# Patient Record
Sex: Female | Born: 1985 | Race: White | Hispanic: No | Smoking: Never smoker
Health system: Southern US, Community
[De-identification: ages and names within clinical notes are randomized; demographics above are authoritative.]

## PROBLEM LIST (undated history)

## (undated) DIAGNOSIS — O039 Complete or unspecified spontaneous abortion without complication: Secondary | ICD-10-CM

## (undated) DIAGNOSIS — R87619 Unspecified abnormal cytological findings in specimens from cervix uteri: Secondary | ICD-10-CM

## (undated) DIAGNOSIS — C539 Malignant neoplasm of cervix uteri, unspecified: Secondary | ICD-10-CM

## (undated) DIAGNOSIS — IMO0002 Reserved for concepts with insufficient information to code with codable children: Secondary | ICD-10-CM

## (undated) DIAGNOSIS — O159 Eclampsia, unspecified as to time period: Secondary | ICD-10-CM

## (undated) DIAGNOSIS — R569 Unspecified convulsions: Secondary | ICD-10-CM

## (undated) HISTORY — PX: CERVICAL BIOPSY  W/ LOOP ELECTRODE EXCISION: SUR135

## (undated) HISTORY — PX: DILATION AND CURETTAGE OF UTERUS: SHX78

## (undated) HISTORY — PX: WISDOM TOOTH EXTRACTION: SHX21

## (undated) HISTORY — DX: Complete or unspecified spontaneous abortion without complication: O03.9

## (undated) SURGERY — Surgical Case
Anesthesia: *Unknown

---
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
## 2004-01-03 ENCOUNTER — Emergency Department (HOSPITAL_COMMUNITY): Admission: EM | Admit: 2004-01-03 | Discharge: 2004-01-03 | Payer: Self-pay | Admitting: Emergency Medicine

## 2004-02-16 ENCOUNTER — Emergency Department (HOSPITAL_COMMUNITY): Admission: EM | Admit: 2004-02-16 | Discharge: 2004-02-16 | Payer: Self-pay | Admitting: Emergency Medicine

## 2004-04-26 ENCOUNTER — Emergency Department (HOSPITAL_COMMUNITY): Admission: EM | Admit: 2004-04-26 | Discharge: 2004-04-26 | Payer: Self-pay | Admitting: Emergency Medicine

## 2004-09-09 ENCOUNTER — Emergency Department (HOSPITAL_COMMUNITY): Admission: EM | Admit: 2004-09-09 | Discharge: 2004-09-09 | Payer: Self-pay | Admitting: Emergency Medicine

## 2005-02-02 ENCOUNTER — Emergency Department (HOSPITAL_COMMUNITY): Admission: EM | Admit: 2005-02-02 | Discharge: 2005-02-02 | Payer: Self-pay | Admitting: Emergency Medicine

## 2005-03-20 ENCOUNTER — Emergency Department (HOSPITAL_COMMUNITY): Admission: EM | Admit: 2005-03-20 | Discharge: 2005-03-20 | Payer: Self-pay | Admitting: Emergency Medicine

## 2005-04-16 ENCOUNTER — Emergency Department (HOSPITAL_COMMUNITY): Admission: EM | Admit: 2005-04-16 | Discharge: 2005-04-16 | Payer: Self-pay | Admitting: Family Medicine

## 2005-07-29 ENCOUNTER — Emergency Department (HOSPITAL_COMMUNITY): Admission: EM | Admit: 2005-07-29 | Discharge: 2005-07-29 | Payer: Self-pay | Admitting: *Deleted

## 2005-07-30 ENCOUNTER — Other Ambulatory Visit: Admission: RE | Admit: 2005-07-30 | Discharge: 2005-07-30 | Payer: Self-pay | Admitting: Obstetrics and Gynecology

## 2005-11-02 DIAGNOSIS — R569 Unspecified convulsions: Secondary | ICD-10-CM

## 2005-11-02 HISTORY — PX: OTHER SURGICAL HISTORY: SHX169

## 2005-11-02 HISTORY — DX: Unspecified convulsions: R56.9

## 2006-02-11 ENCOUNTER — Emergency Department (HOSPITAL_COMMUNITY): Admission: EM | Admit: 2006-02-11 | Discharge: 2006-02-12 | Payer: Self-pay | Admitting: Emergency Medicine

## 2006-04-10 ENCOUNTER — Inpatient Hospital Stay (HOSPITAL_COMMUNITY): Admission: AD | Admit: 2006-04-10 | Discharge: 2006-04-10 | Payer: Self-pay | Admitting: Obstetrics and Gynecology

## 2006-06-13 ENCOUNTER — Observation Stay (HOSPITAL_COMMUNITY): Admission: AD | Admit: 2006-06-13 | Discharge: 2006-06-14 | Payer: Self-pay | Admitting: Obstetrics and Gynecology

## 2006-11-02 HISTORY — PX: BREAST ENHANCEMENT SURGERY: SHX7

## 2006-11-11 ENCOUNTER — Emergency Department (HOSPITAL_COMMUNITY): Admission: EM | Admit: 2006-11-11 | Discharge: 2006-11-11 | Payer: Self-pay | Admitting: Emergency Medicine

## 2006-11-12 ENCOUNTER — Emergency Department (HOSPITAL_COMMUNITY): Admission: EM | Admit: 2006-11-12 | Discharge: 2006-11-12 | Payer: Self-pay | Admitting: Emergency Medicine

## 2006-11-15 ENCOUNTER — Encounter: Admission: RE | Admit: 2006-11-15 | Discharge: 2006-11-15 | Payer: Self-pay | Admitting: Cardiovascular Disease

## 2006-11-26 ENCOUNTER — Emergency Department (HOSPITAL_COMMUNITY): Admission: EM | Admit: 2006-11-26 | Discharge: 2006-11-26 | Payer: Self-pay | Admitting: Emergency Medicine

## 2007-02-05 ENCOUNTER — Inpatient Hospital Stay (HOSPITAL_COMMUNITY): Admission: AD | Admit: 2007-02-05 | Discharge: 2007-02-05 | Payer: Self-pay | Admitting: Obstetrics and Gynecology

## 2007-08-26 IMAGING — CR DG CHEST 2V
2 series · 2 of 2 positions shown · non-contrast
Comparison: 11/11/06.

CLINICAL DATA: Chest pain.
 CHEST - 2 VIEW:

[w chest pa]
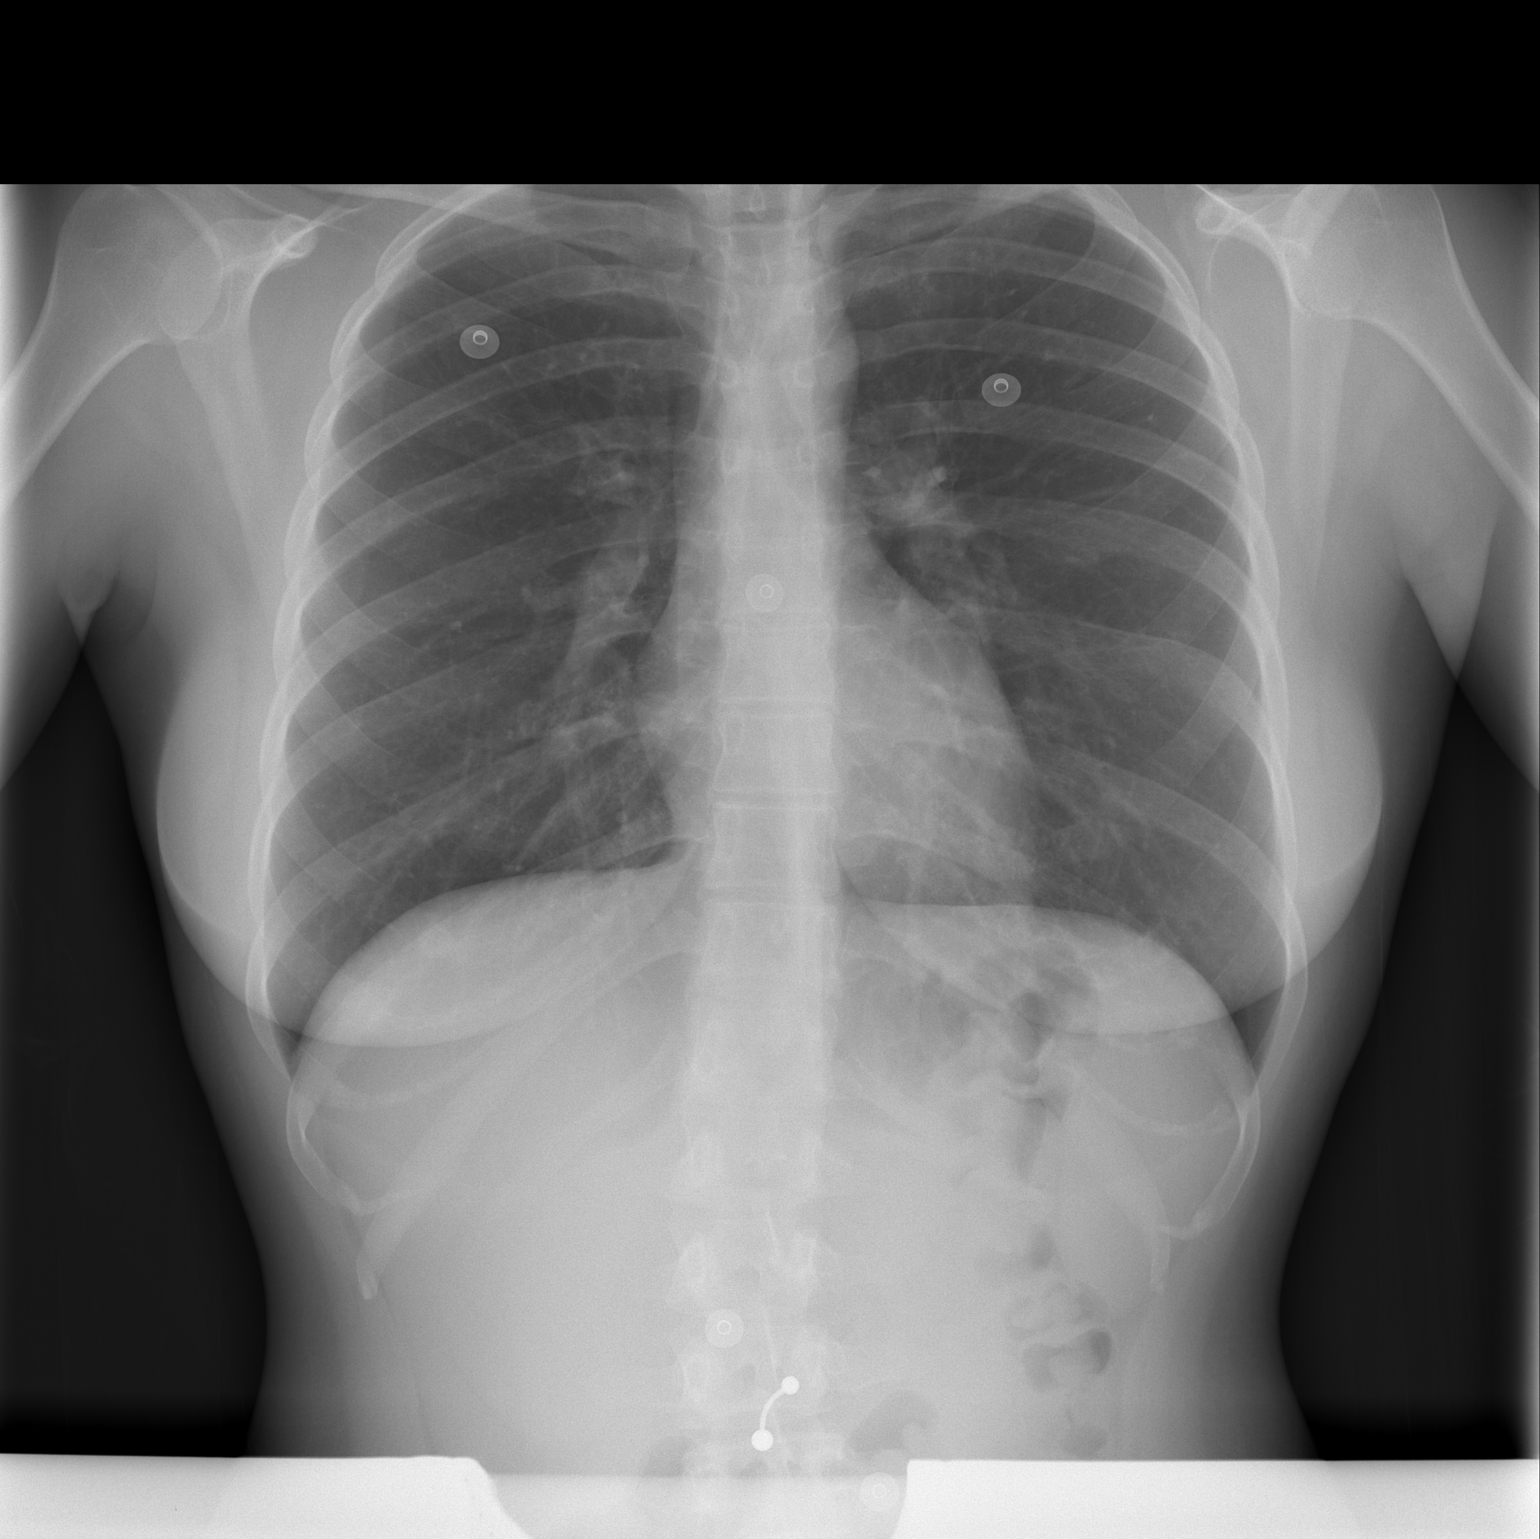

[w chest lat]
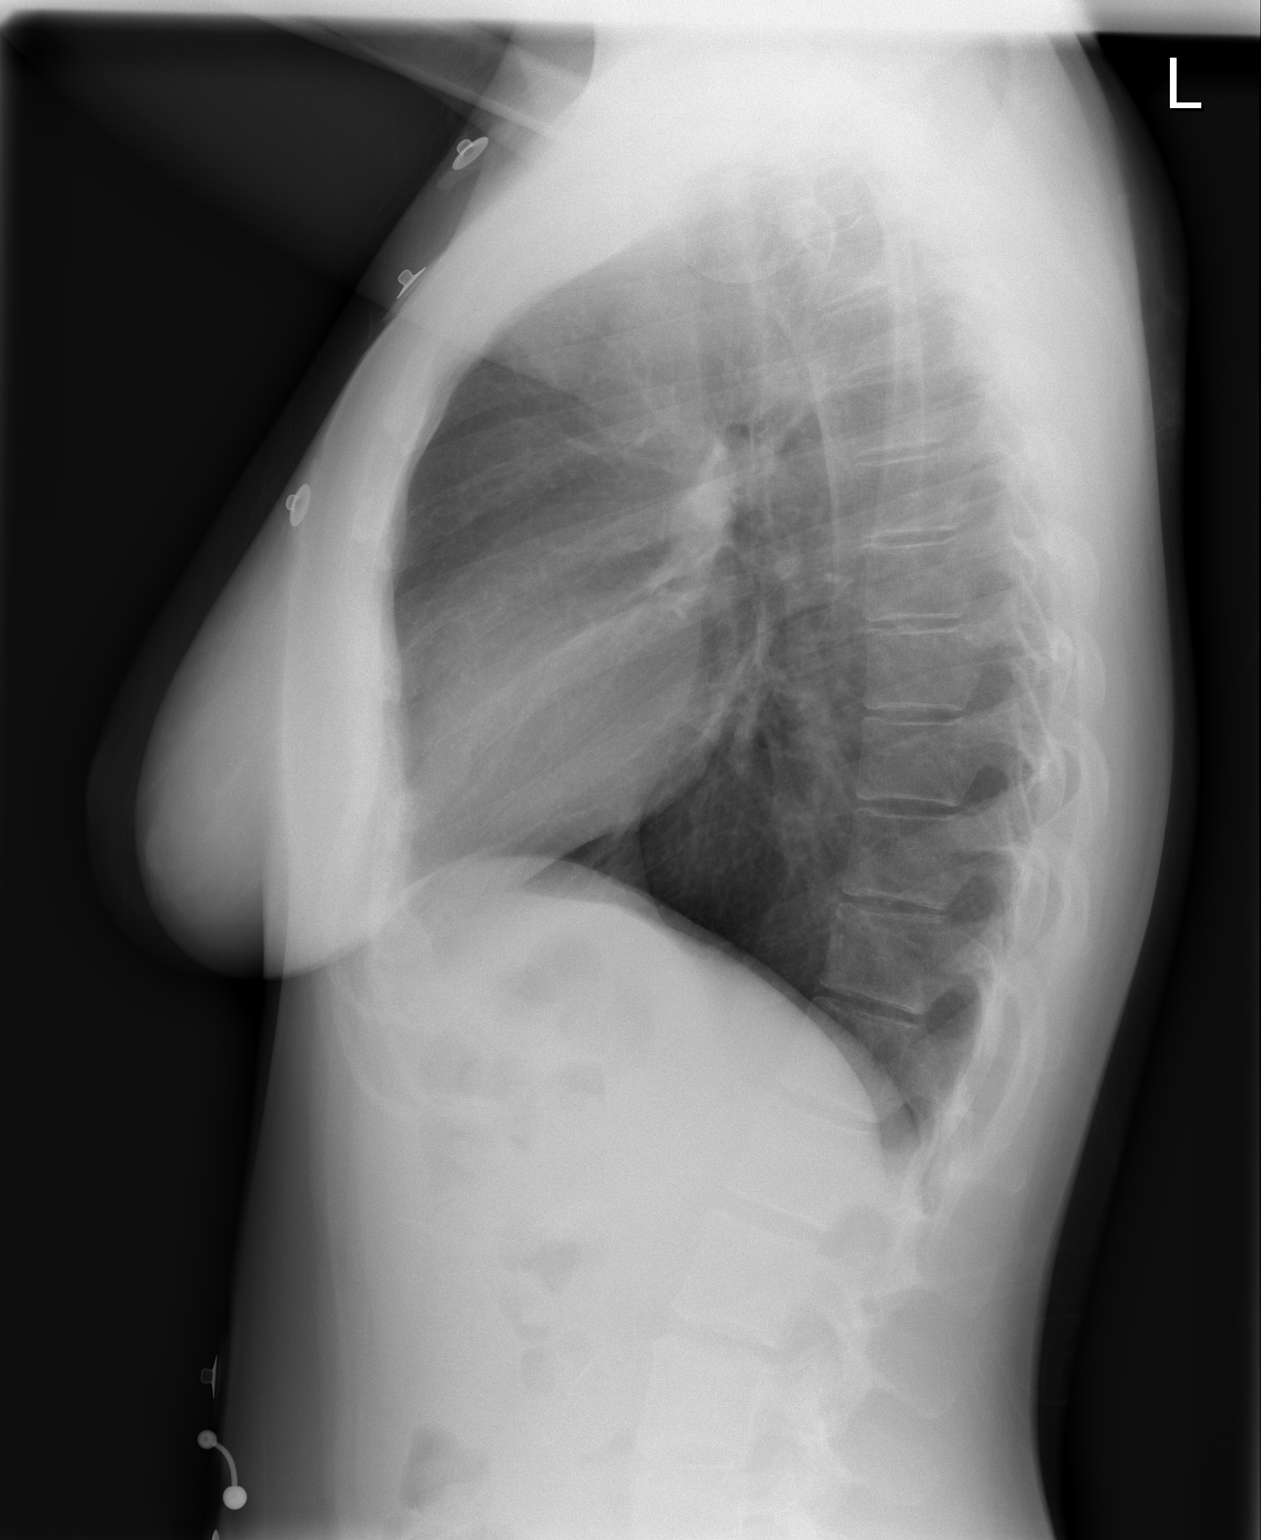

[2 of 2 positions shown; findings below may reference images not displayed]

FINDINGS: The heart size and mediastinal contours are within normal limits.  Both lungs are clear.  The visualized skeletal structures are unremarkable.
IMPRESSION: No active cardiopulmonary disease.

## 2008-05-08 ENCOUNTER — Inpatient Hospital Stay (HOSPITAL_COMMUNITY): Admission: AD | Admit: 2008-05-08 | Discharge: 2008-05-08 | Payer: Self-pay | Admitting: Obstetrics and Gynecology

## 2008-05-31 ENCOUNTER — Encounter (INDEPENDENT_AMBULATORY_CARE_PROVIDER_SITE_OTHER): Payer: Self-pay | Admitting: Obstetrics and Gynecology

## 2008-05-31 ENCOUNTER — Ambulatory Visit (HOSPITAL_COMMUNITY): Admission: RE | Admit: 2008-05-31 | Discharge: 2008-05-31 | Payer: Self-pay | Admitting: Obstetrics and Gynecology

## 2009-02-28 IMAGING — US US OB TRANSVAGINAL
1 series · 14 of 19 positions shown · non-contrast
Comparison: 05/08/2008

CLINICAL DATA: Viability,

TRANSVAGINAL OBSTETRIC <14 WK US
TECHNIQUE: Transvaginal ultrasound examination was performed for
complete evaluation of the gestation as well as the maternal
uterus, adnexal regions, and pelvic cul-de-sac.

[Series 1: us ob transvaginal · 0.16mm/px · 19 acquisitions, 14 frames shown]
[im 1/19]
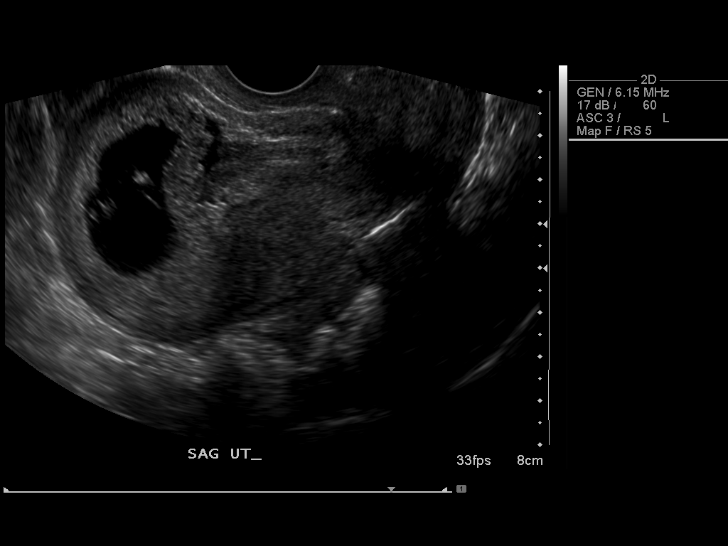
[im 3/19]
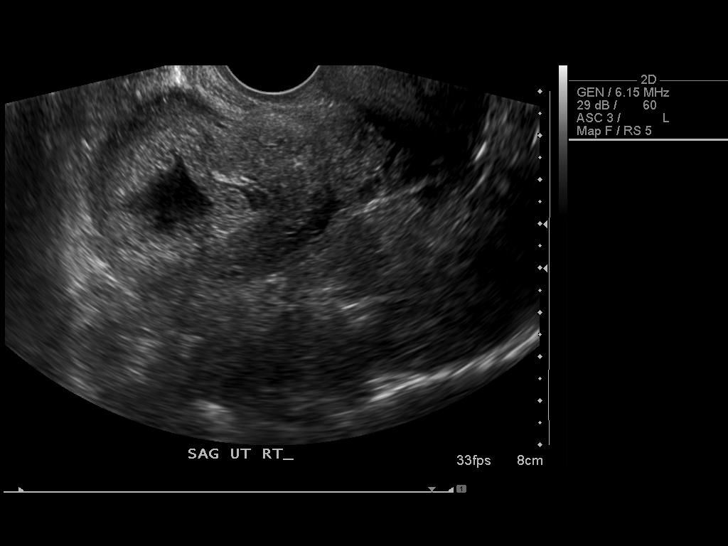
[im 4/19]
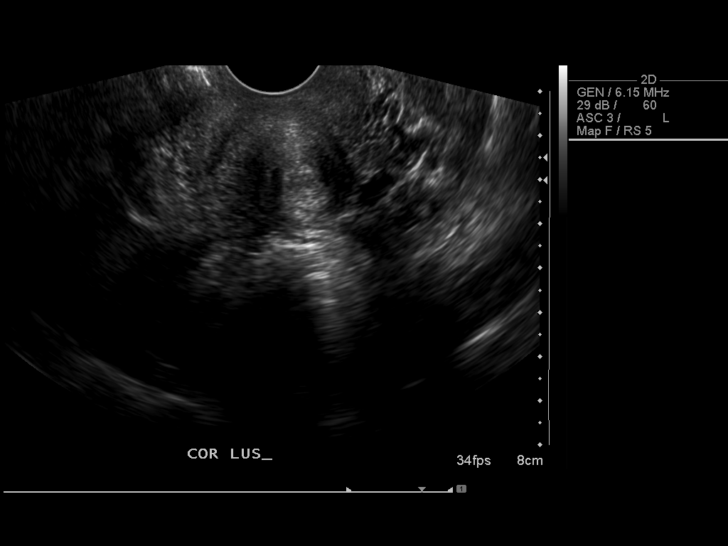
[im 5/19]
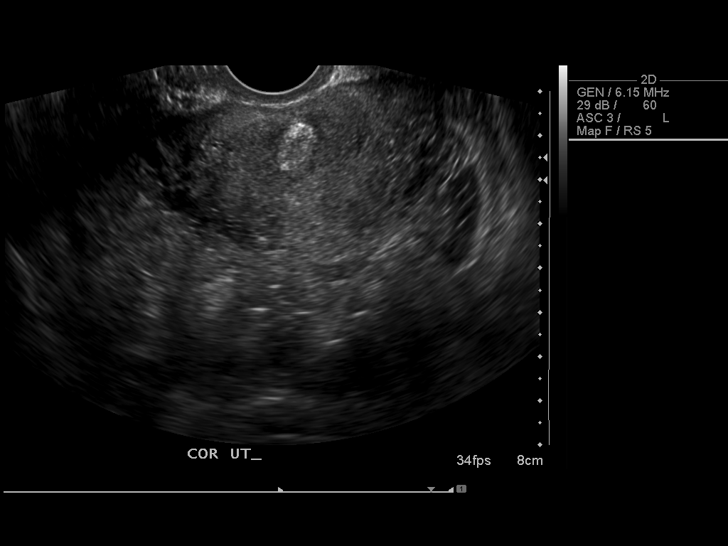
[im 7/19]
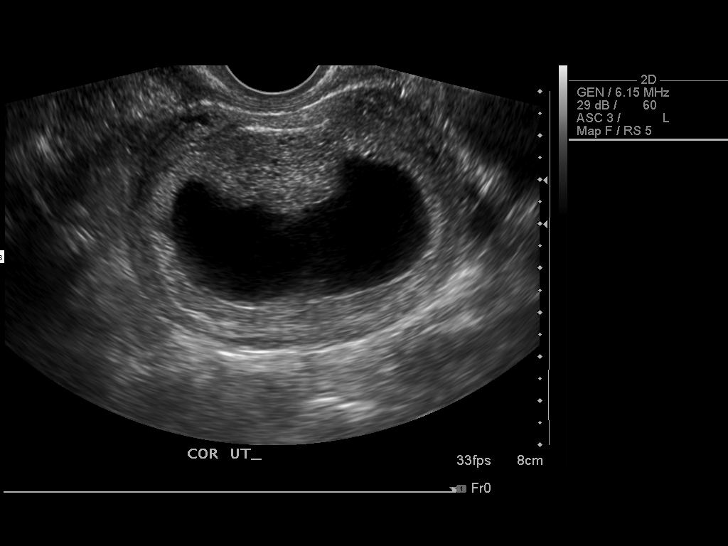
[im 8/19]
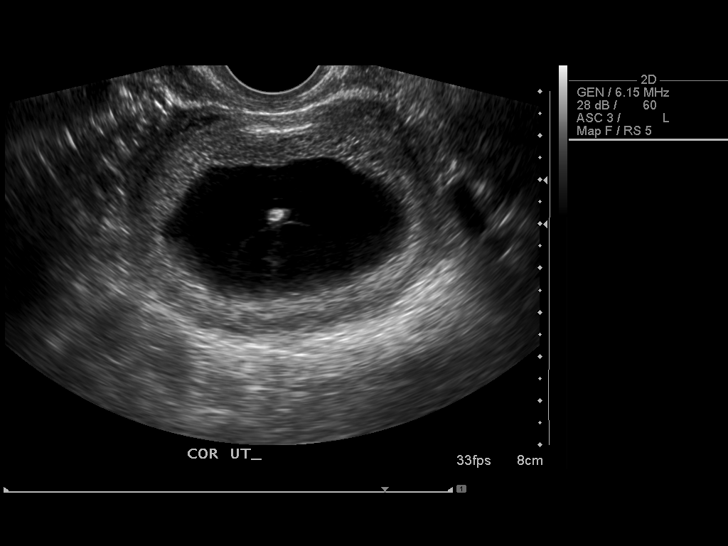
[im 9/19]
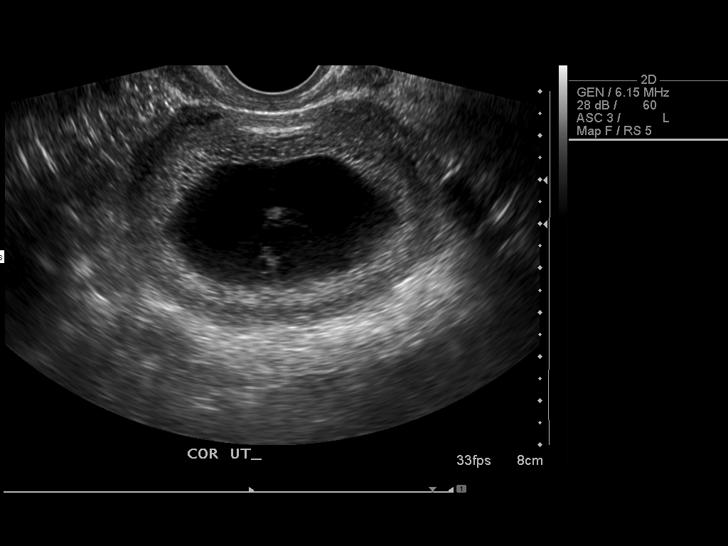
[im 11/19]
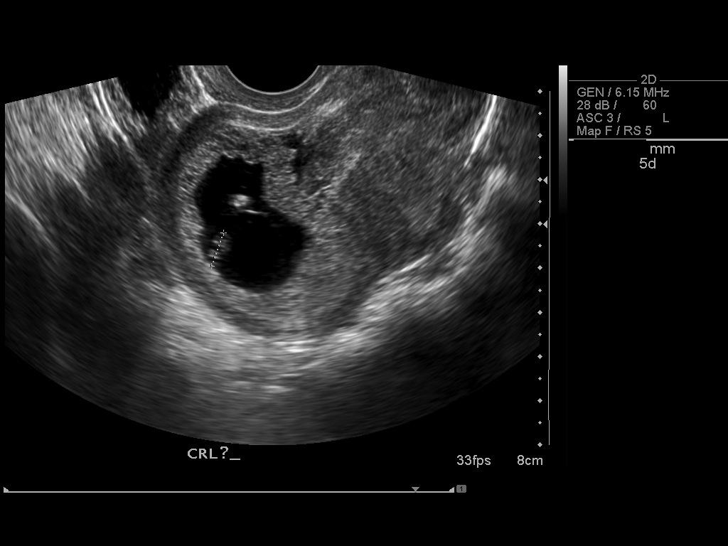
[im 12/19]
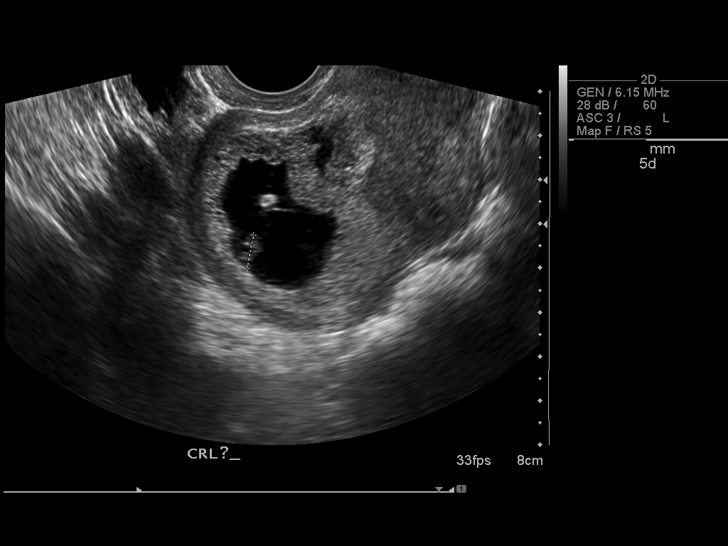
[im 13/19]
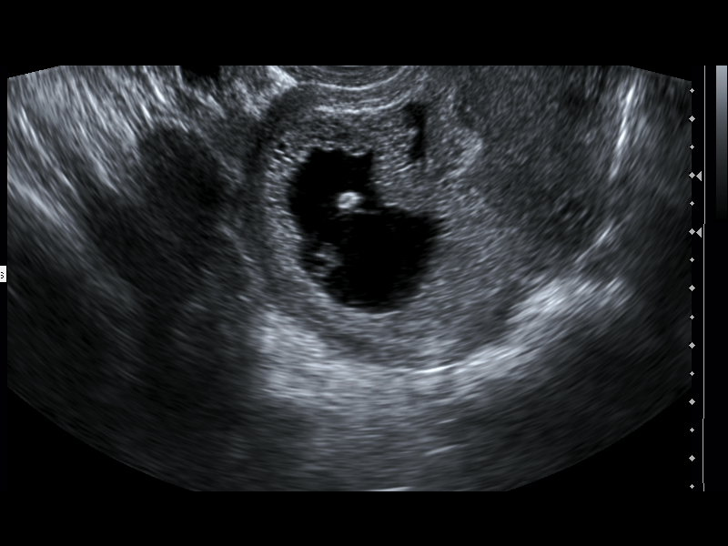
[im 15/19]
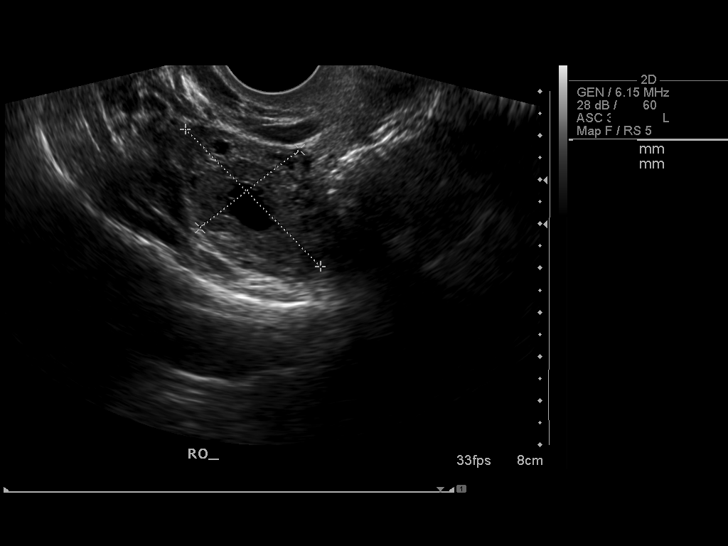
[im 16/19]
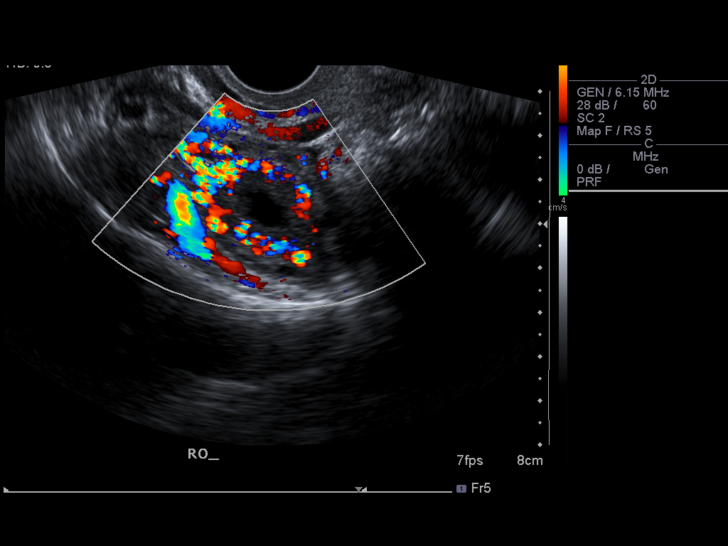
[im 17/19]
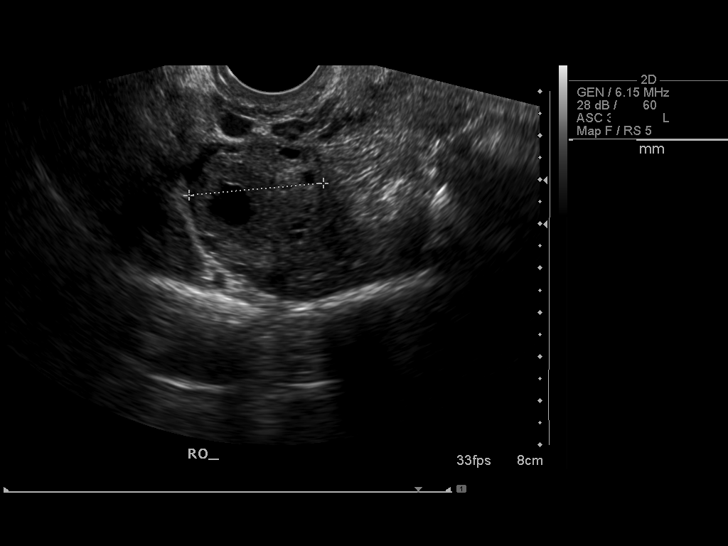
[im 19/19]
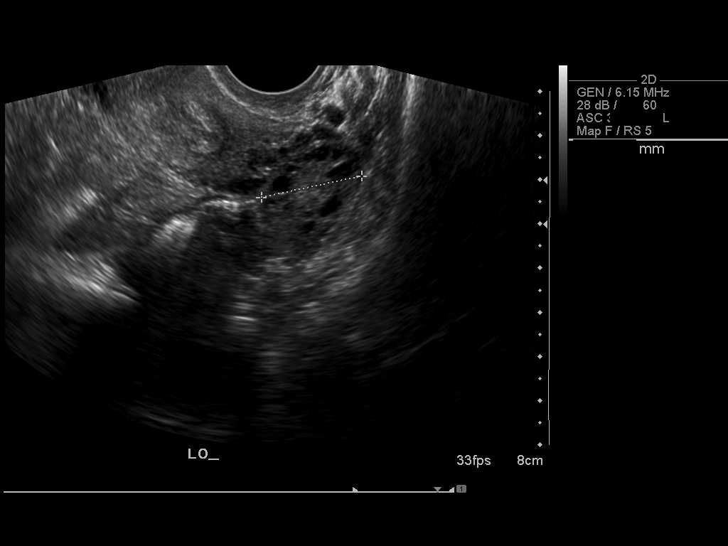

[14 of 19 positions shown; findings below may reference images not displayed]

FINDINGS: There is an intrauterine gestational sac.  Fetal pole is
present with a crown-rump length of 8 mm for estimated gestational
age of 6 weeks 5 days.  No cardiac activity detected.  The size of
the fetal pole is small in comparison to the gestational sac.
Findings concerning for fetal demise.  Estimated gestational age on
prior study from approximately 3 weeks ago was similar, 6 weeks 3
days.

Ovaries unremarkable.  Right corpus luteum cyst noted.
IMPRESSION: Intrauterine pregnancy seen.  Gestational age is only slightly
changed from study 3 weeks ago.  No cardiac activity is detected.
Findings concerning for fetal demise.

## 2009-07-21 ENCOUNTER — Inpatient Hospital Stay (HOSPITAL_COMMUNITY): Admission: AD | Admit: 2009-07-21 | Discharge: 2009-07-21 | Payer: Self-pay | Admitting: Obstetrics and Gynecology

## 2009-10-11 ENCOUNTER — Ambulatory Visit: Payer: Self-pay | Admitting: Oncology

## 2009-10-23 LAB — CBC WITH DIFFERENTIAL/PLATELET
BASO%: 0.3 % (ref 0.0–2.0)
Eosinophils Absolute: 0 10*3/uL (ref 0.0–0.5)
HCT: 35.6 % (ref 34.8–46.6)
MCHC: 35.3 g/dL (ref 31.5–36.0)
MONO#: 0.2 10*3/uL (ref 0.1–0.9)
NEUT#: 4.9 10*3/uL (ref 1.5–6.5)
NEUT%: 79.7 % — ABNORMAL HIGH (ref 38.4–76.8)
RBC: 3.69 10*6/uL — ABNORMAL LOW (ref 3.70–5.45)
WBC: 6.2 10*3/uL (ref 3.9–10.3)
lymph#: 1 10*3/uL (ref 0.9–3.3)

## 2009-10-23 LAB — PROTHROMBIN TIME
INR: 0.93 (ref <1.50)
Prothrombin Time: 12.4 seconds (ref 11.6–15.2)

## 2009-10-23 LAB — COMPREHENSIVE METABOLIC PANEL
ALT: 12 U/L (ref 0–35)
AST: 15 U/L (ref 0–37)
Creatinine, Ser: 0.58 mg/dL (ref 0.40–1.20)
Sodium: 137 mEq/L (ref 135–145)
Total Bilirubin: 0.4 mg/dL (ref 0.3–1.2)

## 2009-10-23 LAB — CHCC SMEAR

## 2009-11-03 ENCOUNTER — Ambulatory Visit: Payer: Self-pay | Admitting: Obstetrics and Gynecology

## 2009-11-03 ENCOUNTER — Inpatient Hospital Stay (HOSPITAL_COMMUNITY): Admission: AD | Admit: 2009-11-03 | Discharge: 2009-11-03 | Payer: Self-pay | Admitting: Obstetrics and Gynecology

## 2009-12-16 ENCOUNTER — Emergency Department (HOSPITAL_COMMUNITY): Admission: EM | Admit: 2009-12-16 | Discharge: 2009-12-16 | Payer: Self-pay | Admitting: Emergency Medicine

## 2009-12-16 ENCOUNTER — Ambulatory Visit: Payer: Self-pay | Admitting: Diagnostic Radiology

## 2009-12-16 ENCOUNTER — Emergency Department (HOSPITAL_BASED_OUTPATIENT_CLINIC_OR_DEPARTMENT_OTHER): Admission: EM | Admit: 2009-12-16 | Discharge: 2009-12-16 | Payer: Self-pay | Admitting: Emergency Medicine

## 2009-12-21 ENCOUNTER — Inpatient Hospital Stay (HOSPITAL_COMMUNITY): Admission: AD | Admit: 2009-12-21 | Discharge: 2009-12-24 | Payer: Self-pay | Admitting: Obstetrics and Gynecology

## 2010-01-22 ENCOUNTER — Inpatient Hospital Stay (HOSPITAL_COMMUNITY): Admission: AD | Admit: 2010-01-22 | Discharge: 2010-01-22 | Payer: Self-pay | Admitting: Obstetrics and Gynecology

## 2010-01-26 ENCOUNTER — Inpatient Hospital Stay (HOSPITAL_COMMUNITY): Admission: AD | Admit: 2010-01-26 | Discharge: 2010-01-27 | Payer: Self-pay | Admitting: Obstetrics and Gynecology

## 2010-01-29 ENCOUNTER — Inpatient Hospital Stay (HOSPITAL_COMMUNITY): Admission: AD | Admit: 2010-01-29 | Discharge: 2010-01-29 | Payer: Self-pay | Admitting: Obstetrics and Gynecology

## 2010-02-12 ENCOUNTER — Encounter (INDEPENDENT_AMBULATORY_CARE_PROVIDER_SITE_OTHER): Payer: Self-pay | Admitting: Obstetrics and Gynecology

## 2010-02-12 ENCOUNTER — Inpatient Hospital Stay (HOSPITAL_COMMUNITY): Admission: RE | Admit: 2010-02-12 | Discharge: 2010-02-15 | Payer: Self-pay | Admitting: Obstetrics and Gynecology

## 2010-03-24 ENCOUNTER — Ambulatory Visit: Payer: Self-pay | Admitting: Oncology

## 2010-11-24 ENCOUNTER — Encounter: Payer: Self-pay | Admitting: Obstetrics and Gynecology

## 2011-01-21 LAB — CBC
HCT: 30.1 % — ABNORMAL LOW (ref 36.0–46.0)
HCT: 30.6 % — ABNORMAL LOW (ref 36.0–46.0)
HCT: 30.8 % — ABNORMAL LOW (ref 36.0–46.0)
HCT: 33.5 % — ABNORMAL LOW (ref 36.0–46.0)
HCT: 34.9 % — ABNORMAL LOW (ref 36.0–46.0)
HCT: 37.5 % (ref 36.0–46.0)
Hemoglobin: 10.3 g/dL — ABNORMAL LOW (ref 12.0–15.0)
Hemoglobin: 10.3 g/dL — ABNORMAL LOW (ref 12.0–15.0)
Hemoglobin: 10.4 g/dL — ABNORMAL LOW (ref 12.0–15.0)
Hemoglobin: 10.6 g/dL — ABNORMAL LOW (ref 12.0–15.0)
Hemoglobin: 10.9 g/dL — ABNORMAL LOW (ref 12.0–15.0)
Hemoglobin: 11.6 g/dL — ABNORMAL LOW (ref 12.0–15.0)
MCHC: 34.1 g/dL (ref 30.0–36.0)
MCHC: 34.4 g/dL (ref 30.0–36.0)
MCHC: 34.7 g/dL (ref 30.0–36.0)
MCV: 97.6 fL (ref 78.0–100.0)
MCV: 98.2 fL (ref 78.0–100.0)
MCV: 99.2 fL (ref 78.0–100.0)
Platelets: 101 10*3/uL — ABNORMAL LOW (ref 150–400)
Platelets: 113 10*3/uL — ABNORMAL LOW (ref 150–400)
Platelets: 128 10*3/uL — ABNORMAL LOW (ref 150–400)
Platelets: 85 10*3/uL — ABNORMAL LOW (ref 150–400)
Platelets: 91 10*3/uL — ABNORMAL LOW (ref 150–400)
RBC: 3.01 MIL/uL — ABNORMAL LOW (ref 3.87–5.11)
RBC: 3.09 MIL/uL — ABNORMAL LOW (ref 3.87–5.11)
RBC: 3.18 MIL/uL — ABNORMAL LOW (ref 3.87–5.11)
RBC: 3.41 MIL/uL — ABNORMAL LOW (ref 3.87–5.11)
RBC: 3.78 MIL/uL — ABNORMAL LOW (ref 3.87–5.11)
RDW: 12.4 % (ref 11.5–15.5)
RDW: 12.8 % (ref 11.5–15.5)
RDW: 13.3 % (ref 11.5–15.5)
RDW: 13.8 % (ref 11.5–15.5)
WBC: 10.3 10*3/uL (ref 4.0–10.5)
WBC: 10.3 10*3/uL (ref 4.0–10.5)
WBC: 5.8 10*3/uL (ref 4.0–10.5)
WBC: 9.8 10*3/uL (ref 4.0–10.5)

## 2011-01-21 LAB — COMPREHENSIVE METABOLIC PANEL
Albumin: 2.4 g/dL — ABNORMAL LOW (ref 3.5–5.2)
Alkaline Phosphatase: 101 U/L (ref 39–117)
BUN: 1 mg/dL — ABNORMAL LOW (ref 6–23)
BUN: 2 mg/dL — ABNORMAL LOW (ref 6–23)
CO2: 22 mEq/L (ref 19–32)
CO2: 23 mEq/L (ref 19–32)
Calcium: 8.2 mg/dL — ABNORMAL LOW (ref 8.4–10.5)
Chloride: 104 mEq/L (ref 96–112)
Creatinine, Ser: 0.51 mg/dL (ref 0.4–1.2)
Creatinine, Ser: 0.58 mg/dL (ref 0.4–1.2)
GFR calc non Af Amer: >60 mL/min (ref >60)
GFR calc non Af Amer: >60 mL/min (ref >60)
Glucose, Bld: 129 mg/dL — ABNORMAL HIGH (ref 70–99)
Glucose, Bld: 99 mg/dL (ref 70–99)
Potassium: 3.2 mEq/L — ABNORMAL LOW (ref 3.5–5.1)
Total Bilirubin: 0.6 mg/dL (ref 0.3–1.2)

## 2011-01-21 LAB — WET PREP, GENITAL: Yeast Wet Prep HPF POC: NONE SEEN

## 2011-01-21 LAB — URINALYSIS, ROUTINE W REFLEX MICROSCOPIC
Bilirubin Urine: NEGATIVE
Hgb urine dipstick: NEGATIVE
Nitrite: NEGATIVE
Protein, ur: NEGATIVE mg/dL
Specific Gravity, Urine: <1.005 — ABNORMAL LOW (ref 1.005–1.030)
Urobilinogen, UA: 0.2 mg/dL (ref 0.0–1.0)

## 2011-01-21 LAB — MRSA PCR SCREENING: MRSA by PCR: NEGATIVE

## 2011-01-21 LAB — OVA AND PARASITE EXAMINATION

## 2011-01-21 LAB — URIC ACID: Uric Acid, Serum: 3.4 mg/dL (ref 2.4–7.0)

## 2011-01-21 LAB — PROTIME-INR: INR: 1.01 (ref 0.00–1.49)

## 2011-01-22 LAB — DIFFERENTIAL
Basophils Absolute: 0 10*3/uL (ref 0.0–0.1)
Eosinophils Relative: 1 % (ref 0–5)
Lymphocytes Relative: 15 % (ref 12–46)
Monocytes Absolute: 0.5 10*3/uL (ref 0.1–1.0)

## 2011-01-22 LAB — CBC
HCT: 34.6 % — ABNORMAL LOW (ref 36.0–46.0)
Hemoglobin: 12.3 g/dL (ref 12.0–15.0)
MCHC: 35.7 g/dL (ref 30.0–36.0)
MCV: 96.1 fL (ref 78.0–100.0)
RDW: 12.3 % (ref 11.5–15.5)

## 2011-01-25 LAB — COMPREHENSIVE METABOLIC PANEL
ALT: 12 U/L (ref 0–35)
AST: 18 U/L (ref 0–37)
Albumin: 2.7 g/dL — ABNORMAL LOW (ref 3.5–5.2)
BUN: 4 mg/dL — ABNORMAL LOW (ref 6–23)
CO2: 22 mEq/L (ref 19–32)
Calcium: 8.6 mg/dL (ref 8.4–10.5)
Chloride: 105 mEq/L (ref 96–112)
Chloride: 106 mEq/L (ref 96–112)
Creatinine, Ser: 0.61 mg/dL (ref 0.4–1.2)
GFR calc Af Amer: >60 mL/min (ref >60)
GFR calc Af Amer: >60 mL/min (ref >60)
GFR calc non Af Amer: >60 mL/min (ref >60)
Sodium: 136 mEq/L (ref 135–145)
Total Bilirubin: 0.2 mg/dL — ABNORMAL LOW (ref 0.3–1.2)
Total Bilirubin: 0.3 mg/dL (ref 0.3–1.2)

## 2011-01-25 LAB — CBC
HCT: 35.8 % — ABNORMAL LOW (ref 36.0–46.0)
MCHC: 34.6 g/dL (ref 30.0–36.0)
MCV: 98 fL (ref 78.0–100.0)
Platelets: 131 10*3/uL — ABNORMAL LOW (ref 150–400)
RBC: 3.49 MIL/uL — ABNORMAL LOW (ref 3.87–5.11)
RDW: 13.4 % (ref 11.5–15.5)
WBC: 9 10*3/uL (ref 4.0–10.5)
WBC: 9.8 10*3/uL (ref 4.0–10.5)

## 2011-01-25 LAB — URINE MICROSCOPIC-ADD ON

## 2011-01-25 LAB — URINALYSIS, ROUTINE W REFLEX MICROSCOPIC
Bilirubin Urine: NEGATIVE
Bilirubin Urine: NEGATIVE
Ketones, ur: NEGATIVE mg/dL
Nitrite: NEGATIVE
Specific Gravity, Urine: 1.005 (ref 1.005–1.030)
Urobilinogen, UA: 0.2 mg/dL (ref 0.0–1.0)
pH: 6.5 (ref 5.0–8.0)
pH: 6.5 (ref 5.0–8.0)

## 2011-01-25 LAB — URIC ACID: Uric Acid, Serum: 4.7 mg/dL (ref 2.4–7.0)

## 2011-03-17 NOTE — H&P (Signed)
NAMEREIA, VIERNES               ACCOUNT NO.:  [id number]   MEDICAL RECORD NO.:  [id number]          PATIENT TYPE:  AMB   LOCATION:  SDC                           FACILITY:  [facility]   PHYSICIAN:  [name], M.D.   DATE OF BIRTH:  10/13/1986   DATE OF ADMISSION:  05/31/2008  DATE OF DISCHARGE:                              HISTORY & PHYSICAL   The patient is a 25 year old gravida 3, para 0-1-1-0 female with last  menstrual period of Mar 16, 2008.  She came in for a new OB workup.  Because of unsure date, she underwent an ultrasound.  Ultrasound  revealed a gestational sac consistent with 6 weeks and 4 days.  There  was a fetal pole of 6 mm.  No fetal heart rate was detected.  She had a  prior ultrasound done at the hospital indicating 6 weeks pregnancy with  heart activity.  Indeed, this is consistent with a nonviable first  trimester pregnancy.  The patient presents now for dilatation and  evacuation.   In terms of allergies, she has no known drug allergies.   MEDICATIONS:  Prenatal vitamins, baby aspirin, and calcium.   PAST MEDICAL HISTORY:  1. The patient has a history of tachycardia.  She had previous workup      by the cardiologist, was diagnosed with supraventricular      tachycardia.  2. She has a history of panic attacks.   PAST SURGICAL HISTORY:  She had cesarean section.  Wisdom teeth were  extracted.  Also had breast implants.  Had a tonsillectomy in 2003.  First pregnancy complicated by severe pregnancy-induced hypertension,  had a cesarean section done at that time, the infant unfortunately  expired after 62 days of age.   FAMILY HISTORY:  Noncontributory.   SOCIAL HISTORY:  Reveals no tobacco or alcohol use.   REVIEW OF SYSTEMS:  Noncontributory.   PHYSICAL EXAMINATION:  VITAL SIGNS:  The patient is afebrile with stable  vital signs.  HEENT:  The patient is normocephalic.  Pupils equal, round, reactive to  light and accommodation.  Extraocular movements  were intact.  Sclerae  and conjunctivae are clear.  Oropharynx clear.  LUNGS:  Clear.  CARDIAC SYSTEM:  Regular rhythm and rate.  There is no murmurs or  gallops.  ABDOMEN:  Benign.  No mass, organomegaly, or tenderness.  PELVIC:  Normal external genitalia.  Vaginal mucosa is clear.  Cervix  unremarkable.  Uterus approximately 8 weeks in size.  Adnexa  unremarkable.  EXTREMITIES:  Trace edema.  NEUROLOGIC:  Grossly within normal limits.   IMPRESSION:  1. Nonviable first trimester pregnancy.  2. History of supraventricular tachycardia.   PLAN:  The patient will undergo dilatation and evacuation.  The risk of  procedure have been discussed including the risk of infection.  Risk of  hemorrhage could require transfusion.  Transfusion care with a risk of  AIDS or hepatitis.  Excessive bleeding could require hysterectomy.  There is a risk of perforation leading to injury to adjacent organs such  as bowel requiring further exploratory surgery.  Risk of deep venous  thrombosis and pulmonary embolus.  The patient does understand  indications and risks.      [name], M.D.  Electronically Signed     JSM/MEDQ  D:  05/31/2008  T:  05/31/2008  Job:  [id number]

## 2011-03-17 NOTE — Op Note (Signed)
NAMEHerbie Baltimore Robertson               ACCOUNT NO.:  [id number]   MEDICAL RECORD NO.:  [id number]          PATIENT TYPE:  AMB   LOCATION:  SDC                           FACILITY:  [facility]   PHYSICIAN:  [name], M.D.   DATE OF BIRTH:  [birth year]   DATE OF PROCEDURE:  DATE OF DISCHARGE:                               OPERATIVE REPORT   PREOPERATIVE DIAGNOSIS:  Nonviable first trimester pregnancy.   POSTOPERATIVE DIAGNOSIS:  Nonviable first trimester pregnancy.   OPERATIVE PROCEDURE:  1. Paracervical block.  2. Dilatation and evacuation.   SURGEON:  [name], MD.   ANESTHESIA:  General plus paracervical block.   ESTIMATED BLOOD LOSS:  100-150 mL.   PACKS AND DRAINS:  None.   INTRAOPERATIVE BLOOD PLACED:  None.   COMPLICATIONS:  None.   INDICATIONS:  Are as dictated in the history and physical.   PROCEDURE:  The patient was taken to the OR and placed supine position.  After satisfactory level of general anesthesia was obtained, the patient  was placed in the dorsal lithotomy position using the Allen stirrups.  Perineum and vagina prepped out with Betadine.  The patient was then  draped with sterile field.  Speculum was placed in the vaginal vault.  The cervix was grasped with a single-tooth tenaculum.  Paracervical  block was anesthetized using 1% Nesacaine.  Uterus sounded to 10 cm.  Cervix was serially dilated to a size 29 Pratt dilator.  Size #8 curved  suction curette was introduced into the intrauterine cavity and the  intrauterine cavity was emptied using suction curetting.  This was  continued until no additional tissue was obtained.  We then curetted and  felt that all quadrants were clear.  We repeat suction curetting, no  additional tissue was obtained, and we re-curetted one more time feeling  that all quadrants were clear.  At this point in time, the single-tooth  tenaculum and speculum were then removed.  The  uterus was contracting down well.  Bleeding was  minimal.  There was no  signs of perforation.  The patient was extubated and was transferred to  recovery room in good condition.  Sponge, instrument, and needle count  were reported as correct by circulating nurse.      [name], M.D.  Electronically Signed     JSM/MEDQ  D:  05/31/2008  T:  05/31/2008  Job:  [id number]

## 2011-03-20 NOTE — Op Note (Signed)
[place]  Patient:    Melinda Robertson, Melinda Robertson                        MRN: [id number] Proc. Date: 04/06/01 Adm. Date:  [id number] Attending:  Susy Frizzle CC:         Luz Brazen, M.D.   Operative Report  PREOPERATIVE DIAGNOSIS:  Chronic tonsillitis, tonsil hypertrophy.  POSTOPERATIVE DIAGNOSIS:  Chronic tonsillitis, tonsil hypertrophy, and peritonsillar abscess.  PROCEDURE:  Tonsillectomy.  SURGEON:  Jefry H. Pollyann Kennedy, M.D.  ANESTHESIA:  General endotracheal anesthesia.  ESTIMATED BLOOD LOSS:  5 cc.  COMPLICATIONS:  None.  FINDINGS:  Severe enlargement of the tonsils bilaterally with deep cryptic spaces and yellow-greenish type of debris within the tonsil crypts.  Small abscessed left mid to peritonsillar plane.  The patient tolerated the procedure well, was awakened, extubated and transferred to recovery in stable condition.  REFERRING PHYSICIAN:  Luz Brazen, M.D.  HISTORY:  A 25 year old with a history of chronic and recurring tonsillitis. Risks, benefits, alternatives and complications of the procedure were explained to the patient and her mother, who seemed to understand and agreed to surgery.  PROCEDURE:  The patient was taken to the operating room, placed on the operating table in a supine position.  Following induction of general endotracheal anesthesia, the table was turned 90 degrees and the patient was draped in a standard fashion.  A Crowe-Davis mouthgag was inserted into the oral cavity used to retract the tongue and mandible and attached to the Mayo stand.  A red rubber catheter was inserted into the right side of the nose, withdrawn through the mouth and used to retract the soft palate and uvula. Indirect examination of the nasopharynx was performed.  No significant adenoid hypertrophy or infection was identified.  The tonsils were removed bilaterally using electrocautery dissection, carefully dissecting avascular tissue  between the tonsil capsule and the constrictor muscles. Minimally bleeding was encountered along the dissection.  The most difficult part of the dissection was the left mid to upper tonsillar fossa area, where the abscess was identified and the surrounding tissue was significantly inflamed and fibrosed. The tonsils were sent together for pathologic evaluation.  The pharynx was suctioned of blood and secretions; irrigated with saline solution.  A nasogastric tube was passed through the oral cavity into the stomach to aspirate the gastric contents.  A large amount of carbonated beverage was obtained.  The mouthgag was released.  There was no further bleeding.  The patient was then awakened, extubated, and transferred to recovery. DD:  04/06/01 TD:  04/06/01 Job: [id number]

## 2011-03-20 NOTE — Discharge Summary (Signed)
Melinda Robertson, Melinda Robertson               ACCOUNT NO.:  [id number]   MEDICAL RECORD NO.:  [id number]          PATIENT TYPE:  OBV   LOCATION:  9499                          FACILITY:  [facility]   PHYSICIAN:  Malva Limes, M.D.    DATE OF BIRTH:  01/26/1986   DATE OF ADMISSION:  06/13/2006  DATE OF DISCHARGE:  06/14/2006                                 DISCHARGE SUMMARY   FINAL DIAGNOSES:  1. Intrauterine pregnancy at 25-weeks' gestation.  2. Severe preeclampsia.  3. Family history of thromboembolic disorders.  4. Transfer to [facility] secondary to closed NICU.   This 25 year old G2, P-0-0-1-0 presents at 30-weeks' gestation with blurry  vision, facial edema, and episodes of expressive aphasia. The patient was  not having any epigastric pain. Otherwise, her antepartum course was  uncomplicated. She had an elevated risk of Down on her quad screen but had  an amniocentesis showing 4, XY karyotype. Otherwise, her antepartum course  had been uncomplicated. She was admitted at this time. Denies any smoking,  drinking or illicit drug use. Labs were obtained.   The patient was admitted to the AICU, started on mag sulfate, labetalol,  started on betamethasone course. Had a full panel of labs checking for  thrombophilia as well as for preeclamptic labs. TED hose was given, and the  NICU was consulted. Also, Dr. Kirby Funk of [facility] perineonatology  was consulted on this patient. Ultrasound showed growth less than 5th  percentile with a low AFI, and umbilical artery showed no end-diastolic flow  and just abnormal flow. The patient was also diagnosed at that point with  IUGR and oligohydramnios with abnormal Dopplers. The current situation is  that there was no bed in the NICU at this point and that patient might  needed to be transferred. Dr. Margot Ables saw the patient, discussed the case  with Dr. __________ and felt that the patient needed to be transferred to  [facility]  secondary to our decrease of beds in the NICU and the  need for this patient to be delivered. Her mag sulfate was increased, and  she was transferred on June 14, 2006. I do not have any more information  in the chart of what happened after the transfer.   LABORATORY DATA ON DISCHARGE:  The patient had a hemoglobin of 12.3, white  blood cell count of 11.5, platelets 112,000. She was negative for Factor V  mutation, negative for lupus anticoagulant, had a normal total protein C,  did have low protein S - total and functional protein S. Otherwise, patient  never had changes in her liver function tests, any elevated uric acid. I  also see that the patient did have a positive group B strep and that she  also had a low anticardiolipin antibody IgM antibody.      Leilani Able, P.A.-C.    ______________________________  Malva Limes, M.D.    MB/MEDQ  D:  07/12/2006  T:  07/13/2006  Job:  [id number]

## 2011-07-30 LAB — DIFFERENTIAL
Basophils Relative: 0
Monocytes Relative: 6
Neutro Abs: 3.4
Neutrophils Relative %: 63

## 2011-07-30 LAB — COMPREHENSIVE METABOLIC PANEL
Alkaline Phosphatase: 32 — ABNORMAL LOW
BUN: 6
Calcium: 8.8
Creatinine, Ser: 0.53
Glucose, Bld: 126 — ABNORMAL HIGH
Potassium: 3.3 — ABNORMAL LOW
Total Protein: 5.7 — ABNORMAL LOW

## 2011-07-30 LAB — CBC
HCT: 33.6 — ABNORMAL LOW
Hemoglobin: 11.7 — ABNORMAL LOW
MCHC: 34.9
MCV: 95.7
RDW: 12

## 2011-07-31 LAB — ABO/RH: ABO/RH(D): A POS

## 2011-07-31 LAB — CBC
HCT: 37.7
Platelets: 126 — ABNORMAL LOW
WBC: 4.6

## 2012-06-14 ENCOUNTER — Other Ambulatory Visit: Payer: Self-pay | Admitting: Obstetrics and Gynecology

## 2012-06-22 ENCOUNTER — Other Ambulatory Visit: Payer: Self-pay | Admitting: Obstetrics and Gynecology

## 2012-07-04 ENCOUNTER — Inpatient Hospital Stay (HOSPITAL_COMMUNITY)
Admission: AD | Admit: 2012-07-04 | Discharge: 2012-07-04 | Disposition: A | Discharge status: Home / Self Care | Payer: BC Managed Care – PPO | Source: Ambulatory Visit | Attending: Obstetrics & Gynecology | Admitting: Obstetrics & Gynecology

## 2012-07-04 ENCOUNTER — Encounter (HOSPITAL_COMMUNITY): Payer: Self-pay | Admitting: *Deleted

## 2012-07-04 DIAGNOSIS — R102 Pelvic and perineal pain: Secondary | ICD-10-CM

## 2012-07-04 DIAGNOSIS — R109 Unspecified abdominal pain: Secondary | ICD-10-CM | POA: Insufficient documentation

## 2012-07-04 HISTORY — DX: Eclampsia, unspecified as to time period: O15.9

## 2012-07-04 HISTORY — DX: Unspecified abnormal cytological findings in specimens from cervix uteri: R87.619

## 2012-07-04 HISTORY — DX: Reserved for concepts with insufficient information to code with codable children: IMO0002

## 2012-07-04 LAB — CBC WITH DIFFERENTIAL/PLATELET
HCT: 36.3 % (ref 36.0–46.0)
Hemoglobin: 12.6 g/dL (ref 12.0–15.0)
Lymphocytes Relative: 40 % (ref 12–46)
Lymphs Abs: 2.2 10*3/uL (ref 0.7–4.0)
MCHC: 34.7 g/dL (ref 30.0–36.0)
Monocytes Absolute: 0.5 10*3/uL (ref 0.1–1.0)
Monocytes Relative: 9 % (ref 3–12)
Neutro Abs: 2.7 10*3/uL (ref 1.7–7.7)
WBC: 5.6 10*3/uL (ref 4.0–10.5)

## 2012-07-04 LAB — URINALYSIS, ROUTINE W REFLEX MICROSCOPIC
Bilirubin Urine: NEGATIVE
Glucose, UA: NEGATIVE mg/dL
Specific Gravity, Urine: 1.01 (ref 1.005–1.030)
pH: 7 (ref 5.0–8.0)

## 2012-07-04 LAB — URINE MICROSCOPIC-ADD ON

## 2012-07-04 MED ORDER — OXYCODONE-ACETAMINOPHEN 5-325 MG PO TABS: 1.0000 | ORAL_TABLET | ORAL | Status: AC | PRN

## 2012-07-04 MED ORDER — ONDANSETRON 8 MG PO TBDP
8.0000 mg | ORAL_TABLET | Freq: Once | ORAL | Status: AC
  Administered 2012-07-04: 8 mg via ORAL
  Filled 2012-07-04: qty 1

## 2012-07-04 MED ORDER — OXYCODONE-ACETAMINOPHEN 5-325 MG PO TABS
1.0000 | ORAL_TABLET | Freq: Once | ORAL | Status: AC
  Administered 2012-07-04: 1 via ORAL
  Filled 2012-07-04: qty 1

## 2012-07-04 NOTE — MAU Note (Signed)
Had a leep on 06/21/12.  Pain has been getting increasing worse.  Ibuprofen not helping.  Spoke with physicians who told her to come and be seen.  Has been passing clots with every bowel movement and urination.  Not saturating pad

## 2012-07-04 NOTE — MAU Note (Signed)
C/O severe abdominal cramping and passing clots with urination.  S/p LEEP on 8/20 with CIS on path.  Had a couple days of spotting and cramping immediately following the procedure which then resolved and has had clear discharge until a couple days ago.  Denies fever and chills.  +nausea brought on by pain.  Pain not relieved with ibuprofen.  Patient has not had a period for several years secondary to pregnancies and Mirena IUD which was removed at the time of LEEP.    VSS. AF.  CBC w/ diff and UA wnl x blood.   SSE: nl external genitalia.  Moderate amt of light pink discharge in vault.  Well healing LEEP bed without appearance of infection.  Discomfort elicited when speculum blade hit cervix.    26yo POD#13 s/p LEEP with new pelvic pain. Pain: suspected return of menses.  Will treat with Zofran and Percocet and Rx Percocet alternated with ibuprofen. Will follow-up in office tomorrow with Dr. Arelia Sneddon to review results of LEEP and her pain can be reevaluated at that time.  Bleeding and infection precautions given.    MM

## 2012-07-04 NOTE — MAU Provider Note (Signed)
History     CSN: [id number]  Arrival date and time: 07/04/12 1947   First Provider Initiated Contact with Patient 07/04/12 2117      Chief Complaint  Patient presents with  . Abdominal Pain   HPI Melinda Robertson is a 26 y.o. female who presents to MAU with abdominal pain. Had a LEEP procedure 06/21/12. Had cramping after but would get better with ibuprofen. But then last night began having increased cramping and took ibuprofen and was able to sleep. Today the cramping started back and took ibuprofen without results. Called on call doctor and told to take an additional 400 mg of ibuprofen but has continued to have pain. She rates the pain as 7/10. She describes the pain as cramping and sharp. Associated symptoms include nausea, passing clots and low grade fever this morning. Was trying to wait until follow up visit tomorrow but came in due to pain. Has appointment with Dr. Arelia Robertson tomorrow and with Dr. Loree Robertson 07/15/12. The history was provided by the patient.  OB History    Grav Para Term Preterm Abortions TAB SAB Ect Mult Living   4 2  2 2  2   1       Past Medical History  Diagnosis Date  . Abnormal Pap smear   . Eclampsia     Past Surgical History  Procedure Date  . Cesarean section   . Dilation and curettage of uterus   . Cervical biopsy  w/ loop electrode excision   . Breast enhancement surgery 2008  . Butt implants 2007    Family History  Problem Relation Age of Onset  . Diabetes Maternal Grandfather   . Hypertension Maternal Grandfather     History  Substance Use Topics  . Smoking status: Never Smoker   . Smokeless tobacco: Not on file  . Alcohol Use: Yes     Occassionally    Allergies: No Known Allergies  Prescriptions prior to admission  Medication Sig Dispense Refill  . diazepam (VALIUM) 5 MG tablet Take 2.5 mg by mouth at bedtime as needed. For TMJ      . norethindrone-ethinyl estradiol (JUNEL FE,GILDESS FE,LOESTRIN FE) 1-20 MG-MCG tablet Take 1  tablet by mouth daily.      . Prenatal Vit-Fe Fumarate-FA (PRENATAL MULTIVITAMIN) TABS Take 1 tablet by mouth every evening.        Review of Systems  Constitutional: Positive for fever and chills. Negative for weight loss.  HENT: Negative for ear pain, nosebleeds, congestion, sore throat and neck pain.   Eyes: Negative for blurred vision, double vision, photophobia and pain.  Respiratory: Negative for cough, shortness of breath and wheezing.   Cardiovascular: Negative for chest pain, palpitations and leg swelling.  Gastrointestinal: Positive for nausea and abdominal pain. Negative for heartburn, vomiting, diarrhea and constipation.  Genitourinary: Negative for dysuria, urgency and frequency (pressure with urination.).       Vaginal bleeding and passing clots.  Musculoskeletal: Positive for back pain. Negative for myalgias.  Skin: Negative for itching and rash.  Neurological: Negative for dizziness, sensory change, speech change, seizures, weakness and headaches.  Endo/Heme/Allergies: Does not bruise/bleed easily.  Psychiatric/Behavioral: Negative for depression. The patient is not nervous/anxious.    Physical Exam   Blood pressure 108/73, pulse 95, temperature 98.5 F (36.9 C), temperature source Oral, resp. rate 18, height 4' 11.06" (1.5 m), weight 121 lb 9.6 oz (55.157 kg).  Physical Exam  Nursing note and vitals reviewed. Constitutional: She is oriented to person, place,  and time. She appears well-developed and well-nourished.  HENT:  Head: Normocephalic.  Eyes: EOM are normal.  Neck: Normal range of motion. Neck supple.  Cardiovascular: Normal rate.   Respiratory: Effort normal.  GI: Soft. There is tenderness in the suprapubic area and left lower quadrant. There is no rigidity, no rebound and no guarding.  Musculoskeletal: Normal range of motion.  Neurological: She is alert and oriented to person, place, and time.  Psychiatric: She has a normal mood and affect. Her behavior  is normal. Judgment and thought content normal.    MAU Course: Discussed with Dr. Langston Masker and she will come to MAU to evaluate the patient.  Procedures CBC with Diff, U/A pending. Medical screening exam complete and patient is stable to await Dr. Langston Masker.  [name], RN, FNP, [facility] 07/04/2012, 9:18 PM

## 2012-07-15 ENCOUNTER — Encounter: Payer: Self-pay | Admitting: Gynecology

## 2012-07-15 ENCOUNTER — Ambulatory Visit: Payer: BC Managed Care – PPO | Attending: Gynecology | Admitting: Gynecology

## 2012-07-15 VITALS — BP 110/64 | HR 100 | Temp 98.3°F | Resp 20 | Ht 60.47 in | Wt 119.4 lb

## 2012-07-15 DIAGNOSIS — C539 Malignant neoplasm of cervix uteri, unspecified: Secondary | ICD-10-CM | POA: Insufficient documentation

## 2012-07-15 DIAGNOSIS — D069 Carcinoma in situ of cervix, unspecified: Secondary | ICD-10-CM

## 2012-07-15 NOTE — Patient Instructions (Signed)
Please contact Dr. Lisbeth Ply office to schedule cold knife conization

## 2012-07-15 NOTE — Progress Notes (Signed)
Consult Note: Gyn-Onc   Melinda Robertson 26 y.o. female  Chief Complaint  Patient presents with  . Cervical adenocarcinoma    New Consult       HPI: 26 year old white female gravida 3 para 1 seen in consultation request of Dr. 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 Patient had an abnormal Pap smear and initially underwent colposcopy and directed biopsy revealing a high-grade squamous lesion. 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 The patient's had an uncomplicated postoperative course.  Patient has a difficult obstetrical history in that she developed severe preeclampsia early in her pregnancy ultimately delivering by emergency classical cesarean section an infant who died on the 04-03-2023 of life. She also reports that she's had one or 2 miscarriages. Ultimately she did deliver a healthy daughter who is now 22-1/2 years of age.  The patient and her husband have discussed future fertility and appear at this time to believe that they would not like to pursue fertility which will have some bearing on decisions regarding possibility of hysterectomy.  Review of Systems:10 point review of systems is negative as noted above.   Vitals: Blood pressure 110/64, pulse 100, temperature 98.3 F (36.8 C), temperature source Oral, resp. rate 20, height 5' 0.47" (1.536 m), weight 119 lb 6.4 oz (54.159 kg).  Physical Exam: General : The patient is a healthy woman in no acute distress.  HEENT: normocephalic, extraoccular movements normal; neck is supple without thyromegally  Remainder the exam is deferred.   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  A lengthy  discussion of approximately 20 minutes face-to-face consultation time regarding management options. First, I recommend she undergo a cold knife conization with an attempt to excise the endocervical canal. Predominantly we wished to exclude an invasive adenocarcinoma. The patient indicates she would like to have a hysterectomy and therefore if she only has in situ disease and extrafascial hysterectomy would be most appropriate. On the other hand if invasive adenocarcinoma is identified then a radical hysterectomy with pelvic lymphadenectomy would be most appropriate.  She'll return to the care of Dr.MCComb to schedule a cold knife conization. If she has invasive cancer I will be be happy to see her again for further consultation.  No Known Allergies  Past Medical History  Diagnosis Date  . Abnormal Pap smear   . Eclampsia   . Miscarriage 2008, 2009    Past Surgical History  Procedure Date  . Cesarean section   . Dilation and curettage of uterus   . Cervical biopsy  w/ loop electrode excision   . Breast enhancement surgery 2008  . Butt implants 2007  . Tonsillectomy 1999    Current Outpatient Prescriptions  Medication Sig Dispense Refill  . diazepam (VALIUM) 5 MG tablet Take 2.5 mg by mouth at bedtime as needed. For TMJ      . norethindrone-ethinyl estradiol (JUNEL FE,GILDESS FE,LOESTRIN FE) 1-20 MG-MCG tablet Take 1 tablet by mouth daily.      . Prenatal Vit-Fe Fumarate-FA (PRENATAL MULTIVITAMIN) TABS Take 1 tablet by mouth every evening.      Marland Kitchen oxyCODONE-acetaminophen (ROXICET) 5-325 MG per tablet Take 1 tablet by mouth every 4 (four) hours as needed for pain.  30 tablet  0    History   Social  History  . Marital Status: Married    Spouse Name: N/A    Number of Children: N/A  . Years of Education: N/A   Occupational History  . Not on file.   Social History Main Topics  . Smoking status: Never Smoker   . Smokeless tobacco: Not on file  . Alcohol Use: Yes     Occassionally    . Drug Use: No  . Sexually Active: Yes    Birth Control/ Protection: Pill   Other Topics Concern  . Not on file   Social History Narrative  . No narrative on file    Family History  Problem Relation Age of Onset  . Diabetes Maternal Grandfather   . Hypertension Maternal Grandfather       Jeannette Corpus, MD 07/15/2012, 12:58 PM

## 2012-07-18 ENCOUNTER — Encounter (HOSPITAL_COMMUNITY): Payer: Self-pay

## 2012-07-18 ENCOUNTER — Encounter (HOSPITAL_COMMUNITY): Payer: Self-pay | Admitting: *Deleted

## 2012-07-22 NOTE — H&P (Signed)
  Patient name  Fritsche. Lavender DICTATION#  [id number] CSN# [id number]  [name], MD 07/22/2012 5:55 AM

## 2012-07-22 NOTE — H&P (Signed)
NAME:  Melinda Robertson, Melinda Robertson NO.:  [id number]  MEDICAL RECORD NO.:  [id number]  LOCATION:  [facility]                         FACILITY:  [facility]  PHYSICIAN:  [name], M.D.   DATE OF BIRTH:  20-Aug-1986  DATE OF ADMISSION:  07/18/2012 DATE OF DISCHARGE:                             HISTORY & PHYSICAL   Date of her surgery is September 26 at [facility].  HISTORY:  The patient is a 26 year old gravida 3, para 1-1-0-1 female, presents for cold knife conization of the cervix.  The patient was found to have abnormal cervical cytologies suggestive of high-grade dysplasia. 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  She was seen by Dr.  De Blanch, GYN/oncologist.  He recommended proceeding with a cold knife conization of the cervix, which she is admitted for at the present time.  ALLERGIES:  She has no known drug allergies.  MEDICATIONS:  None.  PAST MEDICAL HISTORY:  She has had usual childhood disease without any significant sequelae.  PREVIOUS SURGICAL HISTORY:  She has had 1 prior cesarean section.  Her 1st pregnancy was delivered a previable of 22 weeks by cesarean section due to HELLP syndrome.  She has had bilateral breast implants also.  She has also had 1 D and C.  SOCIAL HISTORY:  No tobacco or alcohol use.  FAMILY HISTORY:  Noncontributory.  REVIEW OF SYSTEMS:  Noncontributory.  PHYSICAL EXAMINATION:  VITAL SIGNS:  The patient is afebrile, stable vital signs. HEENT:  Head is normocephalic.  Pupils equal, round, and reactive to light and accommodation.  Extraocular movements were intact.  Sclerae and conjunctivae are clear.  Oropharynx clear. NECK:  Without thyromegaly. BREAST:  Bilateral implants are noted.  No dominant masses. LUNGS:  Clear. CARDIOVASCULAR  SYSTEM:  Regular rhythm and rate.  No murmurs or gallops. ABDOMEN:  Benign.  Well-healed low-transverse incision. PELVIC:  Normal external genitalia.  Vaginal mucosa is clear.  Cervix unremarkable.  Cone bed healing in nicely.  Uterus normal size, shape, and contour.  Adnexa free of masses or tenderness. EXTREMITIES:  Trace edema. NEUROLOGIC:  Grossly within normal limits.  IMPRESSION:  Previously with adenocarcinoma in situ.  PLAN OF MANAGEMENT:  The patient to undergo cold knife conization of the cervix.  The risks have been discussed including the risk of infection. Risk of vascular injury could lead to hemorrhage requiring transfusion with the risk of AIDS or hepatitis.  Excessive bleeding could require hysterectomy.  There is a risk of injury to adjacent organs requiring further surgical management.  Ultimately in terms of future pregnancy, this could lead to cervical stenosis making it difficult to conceive or possible incompetent cervix requiring cervical cerclage.  Also, the risk of deep venous thrombosis and pulmonary embolus.  The patient does understand indications and risks.     [name], M.D.     JSM/MEDQ  D:  07/22/2012  T:  07/22/2012  Job:  [id number]

## 2012-07-28 ENCOUNTER — Encounter (HOSPITAL_COMMUNITY): Payer: Self-pay | Admitting: *Deleted

## 2012-07-28 ENCOUNTER — Ambulatory Visit (HOSPITAL_COMMUNITY)
Admission: RE | Admit: 2012-07-28 | Discharge: 2012-07-28 | Disposition: A | Discharge status: Home / Self Care | Payer: BC Managed Care – PPO | Source: Ambulatory Visit | Attending: Obstetrics and Gynecology | Admitting: Obstetrics and Gynecology

## 2012-07-28 ENCOUNTER — Encounter (HOSPITAL_COMMUNITY): Payer: Self-pay | Admitting: Anesthesiology

## 2012-07-28 ENCOUNTER — Encounter (HOSPITAL_COMMUNITY)
Admission: RE | Disposition: A | Discharge status: Home / Self Care | Payer: Self-pay | Source: Ambulatory Visit | Attending: Obstetrics and Gynecology

## 2012-07-28 ENCOUNTER — Ambulatory Visit (HOSPITAL_COMMUNITY): Payer: BC Managed Care – PPO | Admitting: Anesthesiology

## 2012-07-28 DIAGNOSIS — D069 Carcinoma in situ of cervix, unspecified: Secondary | ICD-10-CM | POA: Diagnosis present

## 2012-07-28 HISTORY — PX: CERVICAL CONIZATION W/BX: SHX1330

## 2012-07-28 LAB — CBC
MCV: 93.2 fL (ref 78.0–100.0)
Platelets: 247 10*3/uL (ref 150–400)
RBC: 4.44 MIL/uL (ref 3.87–5.11)
WBC: 5.1 10*3/uL (ref 4.0–10.5)

## 2012-07-28 LAB — HCG, SERUM, QUALITATIVE: Preg, Serum: NEGATIVE

## 2012-07-28 SURGERY — CONE BIOPSY, CERVIX
Anesthesia: General | Site: Uterus | Wound class: Clean Contaminated

## 2012-07-28 MED ORDER — ACETIC ACID 4% SOLUTION
Status: DC | PRN
  Administered 2012-07-28: 1 via TOPICAL

## 2012-07-28 MED ORDER — IODINE STRONG (LUGOLS) 5 % PO SOLN
ORAL | Status: DC | PRN
  Administered 2012-07-28: 3 mL via ORAL

## 2012-07-28 MED ORDER — ONDANSETRON HCL 4 MG/2ML IJ SOLN
INTRAMUSCULAR | Status: AC
  Filled 2012-07-28: qty 2

## 2012-07-28 MED ORDER — FENTANYL CITRATE 0.05 MG/ML IJ SOLN
INTRAMUSCULAR | Status: AC
  Filled 2012-07-28: qty 2

## 2012-07-28 MED ORDER — OXYCODONE-ACETAMINOPHEN 5-325 MG PO TABS
1.0000 | ORAL_TABLET | ORAL | Status: DC | PRN
  Administered 2012-07-28: 1 via ORAL

## 2012-07-28 MED ORDER — KETOROLAC TROMETHAMINE 30 MG/ML IJ SOLN
INTRAMUSCULAR | Status: DC | PRN
  Administered 2012-07-28: 30 mg via INTRAVENOUS

## 2012-07-28 MED ORDER — LIDOCAINE HCL (CARDIAC) 20 MG/ML IV SOLN
INTRAVENOUS | Status: DC | PRN
  Administered 2012-07-28: 30 mg via INTRAVENOUS
  Administered 2012-07-28: 20 mg via INTRAVENOUS

## 2012-07-28 MED ORDER — 0.9 % SODIUM CHLORIDE (POUR BTL) OPTIME
TOPICAL | Status: DC | PRN
  Administered 2012-07-28: 1000 mL

## 2012-07-28 MED ORDER — FERRIC SUBSULFATE SOLN
Status: DC | PRN
  Administered 2012-07-28: 1

## 2012-07-28 MED ORDER — PROPOFOL 10 MG/ML IV EMUL
INTRAVENOUS | Status: DC | PRN
  Administered 2012-07-28: 100 mg via INTRAVENOUS

## 2012-07-28 MED ORDER — FENTANYL CITRATE 0.05 MG/ML IJ SOLN
INTRAMUSCULAR | Status: DC | PRN
  Administered 2012-07-28: 50 ug via INTRAVENOUS

## 2012-07-28 MED ORDER — LACTATED RINGERS IV SOLN
INTRAVENOUS | Status: DC
  Administered 2012-07-28: 50 mL/h via INTRAVENOUS

## 2012-07-28 MED ORDER — OXYCODONE-ACETAMINOPHEN 5-325 MG PO TABS
ORAL_TABLET | ORAL | Status: AC
  Filled 2012-07-28: qty 1

## 2012-07-28 MED ORDER — CEFAZOLIN SODIUM-DEXTROSE 2-3 GM-% IV SOLR
2.0000 g | INTRAVENOUS | Status: AC
  Administered 2012-07-28: 2 g via INTRAVENOUS

## 2012-07-28 MED ORDER — CEFAZOLIN SODIUM-DEXTROSE 2-3 GM-% IV SOLR
INTRAVENOUS | Status: AC
  Filled 2012-07-28: qty 50

## 2012-07-28 MED ORDER — MIDAZOLAM HCL 5 MG/5ML IJ SOLN
INTRAMUSCULAR | Status: DC | PRN
  Administered 2012-07-28: 2 mg via INTRAVENOUS

## 2012-07-28 MED ORDER — KETOROLAC TROMETHAMINE 30 MG/ML IJ SOLN
INTRAMUSCULAR | Status: AC
  Filled 2012-07-28: qty 1

## 2012-07-28 MED ORDER — CHLOROPROCAINE HCL 1 % IJ SOLN
INTRAMUSCULAR | Status: AC
  Filled 2012-07-28: qty 30

## 2012-07-28 MED ORDER — PHENYLEPHRINE HCL 10 MG/ML IJ SOLN
INTRAMUSCULAR | Status: AC
  Filled 2012-07-28: qty 1

## 2012-07-28 MED ORDER — LACTATED RINGERS IV SOLN
INTRAVENOUS | Status: DC | PRN
  Administered 2012-07-28: 07:00:00 via INTRAVENOUS

## 2012-07-28 MED ORDER — LIDOCAINE HCL (CARDIAC) 20 MG/ML IV SOLN
INTRAVENOUS | Status: AC
  Filled 2012-07-28: qty 5

## 2012-07-28 MED ORDER — PROPOFOL 10 MG/ML IV EMUL
INTRAVENOUS | Status: AC
  Filled 2012-07-28: qty 20

## 2012-07-28 MED ORDER — ONDANSETRON HCL 4 MG/2ML IJ SOLN
INTRAMUSCULAR | Status: DC | PRN
  Administered 2012-07-28: 4 mg via INTRAVENOUS

## 2012-07-28 MED ORDER — LIDOCAINE-EPINEPHRINE (PF) 1 %-1:200000 IJ SOLN
INTRAMUSCULAR | Status: AC
  Filled 2012-07-28: qty 10

## 2012-07-28 MED ORDER — MIDAZOLAM HCL 2 MG/2ML IJ SOLN
INTRAMUSCULAR | Status: AC
  Filled 2012-07-28: qty 2

## 2012-07-28 MED ORDER — LIDOCAINE-EPINEPHRINE (PF) 1 %-1:200000 IJ SOLN
INTRAMUSCULAR | Status: DC | PRN
  Administered 2012-07-28: 30 mL

## 2012-07-28 SURGICAL SUPPLY — 29 items
APPLICATOR COTTON TIP 6IN STRL (MISCELLANEOUS) IMPLANT
BLADE SURG 11 STRL SS (BLADE) ×2 IMPLANT
CLOTH BEACON ORANGE TIMEOUT ST (SAFETY) ×2 IMPLANT
CONTAINER PREFILL 10% NBF 60ML (FORM) ×2 IMPLANT
COUNTER NEEDLE 1200 MAGNETIC (NEEDLE) IMPLANT
DRESSING TELFA 8X3 (GAUZE/BANDAGES/DRESSINGS) ×2 IMPLANT
ELECT REM PT RETURN 9FT ADLT (ELECTROSURGICAL)
ELECTRODE REM PT RTRN 9FT ADLT (ELECTROSURGICAL) IMPLANT
GAUZE SPONGE 4X4 16PLY XRAY LF (GAUZE/BANDAGES/DRESSINGS) IMPLANT
GLOVE BIO SURGEON STRL SZ7 (GLOVE) ×4 IMPLANT
GOWN PREVENTION PLUS LG XLONG (DISPOSABLE) ×2 IMPLANT
HEMOSTAT SURGICEL 2X14 (HEMOSTASIS) IMPLANT
NEEDLE SPNL 22GX3.5 QUINCKE BK (NEEDLE) ×2 IMPLANT
NS IRRIG 1000ML POUR BTL (IV SOLUTION) ×2 IMPLANT
PACK VAGINAL MINOR WOMEN LF (CUSTOM PROCEDURE TRAY) ×2 IMPLANT
PAD OB MATERNITY 4.3X12.25 (PERSONAL CARE ITEMS) ×2 IMPLANT
PENCIL BUTTON HOLSTER BLD 10FT (ELECTRODE) IMPLANT
SCOPETTES 8  STERILE (MISCELLANEOUS) ×2
SCOPETTES 8 STERILE (MISCELLANEOUS) ×2 IMPLANT
SPONGE SURGIFOAM ABS GEL 12-7 (HEMOSTASIS) IMPLANT
SUT CHROMIC 1MO 4 18 CR8 (SUTURE) ×2 IMPLANT
SYR 20CC LL (SYRINGE) ×2 IMPLANT
SYR CONTROL 10ML LL (SYRINGE) ×2 IMPLANT
SYR TB 1ML 27GX1/2 SAFE (SYRINGE) ×1 IMPLANT
SYR TB 1ML 27GX1/2 SAFETY (SYRINGE) ×1
TOWEL OR 17X24 6PK STRL BLUE (TOWEL DISPOSABLE) ×4 IMPLANT
TUBING NON-CON 1/4 X 20 CONN (TUBING) ×2 IMPLANT
WATER STERILE IRR 1000ML POUR (IV SOLUTION) ×2 IMPLANT
YANKAUER SUCT BULB TIP NO VENT (SUCTIONS) ×2 IMPLANT

## 2012-07-28 NOTE — Anesthesia Preprocedure Evaluation (Signed)
Anesthesia Evaluation  Patient identified by MRN, date of birth, ID band Patient awake    Reviewed: Allergy & Precautions, H&P , Patient's Chart, lab work & pertinent test results, reviewed documented beta blocker date and time   Airway Mallampati: II TM Distance: >3 FB Neck ROM: full    Dental No notable dental hx.    Pulmonary  breath sounds clear to auscultation  Pulmonary exam normal       Cardiovascular Rhythm:regular Rate:Normal     Neuro/Psych    GI/Hepatic   Endo/Other    Renal/GU      Musculoskeletal   Abdominal   Peds  Hematology   Anesthesia Other Findings   Reproductive/Obstetrics                           Anesthesia Physical Anesthesia Plan  ASA: II  Anesthesia Plan: General   Post-op Pain Management:    Induction: Intravenous  Airway Management Planned: LMA, Mask, Simple Face Mask and Natural Airway  Additional Equipment:   Intra-op Plan:   Post-operative Plan:   Informed Consent: I have reviewed the patients History and Physical, chart, labs and discussed the procedure including the risks, benefits and alternatives for the proposed anesthesia with the patient or authorized representative who has indicated his/her understanding and acceptance.   Dental Advisory Given  Plan Discussed with: CRNA and Surgeon  Anesthesia Plan Comments: (  Discussed either MAC vs (LMA)  general anesthesia, including possible nausea, instrumentation of airway, sore throat,pulmonary aspiration, etc. I asked if the were any outstanding questions, or  concerns before we proceeded. )        Anesthesia Quick Evaluation

## 2012-07-28 NOTE — Transfer of Care (Signed)
Immediate Anesthesia Transfer of Care Note  Patient: Melinda Robertson  Procedure(s) Performed: Procedure(s) (LRB) with comments: CONIZATION CERVIX WITH BIOPSY (N/A) - cold knife cone  Patient Location: PACU  Anesthesia Type: General  Level of Consciousness: awake, alert  and oriented  Airway & Oxygen Therapy: Patient Spontanous Breathing  Post-op Assessment: Report given to PACU RN and Post -op Vital signs reviewed and stable  Post vital signs: Reviewed and stable  Complications: No apparent anesthesia complications

## 2012-07-28 NOTE — Brief Op Note (Signed)
07/28/2012  7:57 AM  PATIENT:  Melinda Robertson  26 y.o. female  PRE-OPERATIVE DIAGNOSIS:  adenocarcimona  POST-OPERATIVE DIAGNOSIS:  adenocarcinoma  PROCEDURE:  Procedure(s) (LRB) with comments: CONIZATION CERVIX WITH BIOPSY (N/A) - cold knife cone  SURGEON:  Surgeon(s) and Role:    * [name], MD - Primary  PHYSICIAN ASSISTANT:   ASSISTANTS: none   ANESTHESIA:   local and general  EBL:  Total I/O In: 700 [I.V.:700] Out: -   BLOOD ADMINISTERED:none  DRAINS: none   LOCAL MEDICATIONS USED:  XYLOCAINE with epinephrine and Amount: 20 ml  SPECIMEN:  Source of Specimen:  cervicl cone and endocervical curretting  DISPOSITION OF SPECIMEN:  PATHOLOGY  COUNTS:  YES  TOURNIQUET:  * No tourniquets in log *  DICTATION: .Other Dictation: Dictation Number [id number]  PLAN OF CARE: Discharge to home after PACU  PATIENT DISPOSITION:  PACU - hemodynamically stable.   Delay start of Pharmacological VTE agent (>24hrs) due to surgical blood loss or risk of bleeding: not applicable

## 2012-07-28 NOTE — Op Note (Signed)
Patient name  Melinda Robertson, Melinda Robertson DICTATION#  [id number] CSN# [id number]  [facility], MD 07/28/2012 7:56 AM

## 2012-07-28 NOTE — H&P (Signed)
  History and physical exam unchanged 

## 2012-07-28 NOTE — Op Note (Signed)
NAME:  KYM, SIDDONS                ACCOUNT NO.:  [id number]  MEDICAL RECORD NO.:  [id number]  LOCATION:  [facility]                          FACILITY:  [facility]  PHYSICIAN:  [name], M.D.   DATE OF BIRTH:  1986/05/05  DATE OF PROCEDURE:  07/28/2012 DATE OF DISCHARGE:  07/28/2012                              OPERATIVE REPORT   PREOPERATIVE DIAGNOSIS:  Adenocarcinoma in situ of the endocervix.  POSTOPERATIVE DIAGNOSIS:  Adenocarcinoma in situ of the endocervix.  OPERATIVE PROCEDURE:  Cold knife conization of the cervix.  SURGEON:  [name], MD  ANESTHESIA:  General.  ESTIMATED BLOOD LOSS:  Minimal.  PACKS AND DRAINS:  None.  INTRAOPERATIVE BLOOD PLACED:  None.  COMPLICATIONS:  None.  INDICATIONS:  Noted in the history and physical.  PROCEDURE:  The patient was taken to the OR and placed in supine position.  After satisfactory level of general anesthesia obtained, the patient was placed in dorsal lithotomy position using Allen stirrups. The perineum and vagina were prepped out with Betadine and draped in sterile field.  A speculum was placed in the vaginal vault.  The cervix was secured with a single-tooth tenaculum.  Previous evidence of the LEEP procedure was noted.  Two lateral sutures of 0 chromic were put in place laterally and held.  Cervix infiltrated with 1% Xylocaine with epinephrine 1:200,000.  We used approximately 20 mL.  Next using a scalpel, a cone was obtained.  This was sent for pathology.  We also obtained endocervical curettings. Hemostasis brought about using the bipolar cautery and Monsel's.  We had good hemostasis.  Bladder was emptied by in and out catheterization, 100 mL of clear urine.  No active bleeding noted or other complications.  Lateral sutures were then cut.  The patient taken out of the dorsal lithotomy position.  Once alert, transferred to the recovery room in good condition.  Sponge, instrument, and needle count reported as correct by  circulating nurse x2.     [name], M.D.     JSM/MEDQ  D:  07/28/2012  T:  07/28/2012  Job:  [id number]

## 2012-07-28 NOTE — Anesthesia Postprocedure Evaluation (Signed)
  Anesthesia Post-op Note  Patient: Melinda Robertson  Procedure(s) Performed: Procedure(s) (LRB) with comments: CONIZATION CERVIX WITH BIOPSY (N/A) - cold knife cone Patient is awake and responsive. Pain and nausea are reasonably well controlled. Vital signs are stable and clinically acceptable. Oxygen saturation is clinically acceptable. There are no apparent anesthetic complications at this time. Patient is ready for discharge.

## 2012-07-29 ENCOUNTER — Encounter (HOSPITAL_COMMUNITY): Payer: Self-pay | Admitting: Obstetrics and Gynecology

## 2012-10-05 ENCOUNTER — Encounter (HOSPITAL_BASED_OUTPATIENT_CLINIC_OR_DEPARTMENT_OTHER): Payer: Self-pay

## 2012-10-05 ENCOUNTER — Emergency Department (HOSPITAL_BASED_OUTPATIENT_CLINIC_OR_DEPARTMENT_OTHER)
Admission: EM | Admit: 2012-10-05 | Discharge: 2012-10-05 | Disposition: A | Discharge status: Home / Self Care | Payer: BC Managed Care – PPO | Attending: Emergency Medicine | Admitting: Emergency Medicine

## 2012-10-05 ENCOUNTER — Emergency Department (HOSPITAL_BASED_OUTPATIENT_CLINIC_OR_DEPARTMENT_OTHER): Payer: BC Managed Care – PPO

## 2012-10-05 DIAGNOSIS — Z8541 Personal history of malignant neoplasm of cervix uteri: Secondary | ICD-10-CM | POA: Insufficient documentation

## 2012-10-05 DIAGNOSIS — Z3202 Encounter for pregnancy test, result negative: Secondary | ICD-10-CM | POA: Insufficient documentation

## 2012-10-05 DIAGNOSIS — R509 Fever, unspecified: Secondary | ICD-10-CM | POA: Insufficient documentation

## 2012-10-05 DIAGNOSIS — Z79899 Other long term (current) drug therapy: Secondary | ICD-10-CM | POA: Insufficient documentation

## 2012-10-05 HISTORY — DX: Malignant neoplasm of cervix uteri, unspecified: C53.9

## 2012-10-05 LAB — CBC WITH DIFFERENTIAL/PLATELET
Basophils Absolute: 0 10*3/uL (ref 0.0–0.1)
Basophils Relative: 0 % (ref 0–1)
Eosinophils Absolute: 0.2 10*3/uL (ref 0.0–0.7)
Eosinophils Relative: 2 % (ref 0–5)
Lymphs Abs: 1.7 10*3/uL (ref 0.7–4.0)
MCH: 32.8 pg (ref 26.0–34.0)
Neutrophils Relative %: 63 % (ref 43–77)
Platelets: 172 10*3/uL (ref 150–400)
RBC: 3.93 MIL/uL (ref 3.87–5.11)
RDW: 11.4 % — ABNORMAL LOW (ref 11.5–15.5)

## 2012-10-05 LAB — URINALYSIS, ROUTINE W REFLEX MICROSCOPIC
Ketones, ur: NEGATIVE mg/dL
Leukocytes, UA: NEGATIVE
Nitrite: NEGATIVE
Specific Gravity, Urine: 1.015 (ref 1.005–1.030)
Urobilinogen, UA: 0.2 mg/dL (ref 0.0–1.0)
pH: 7.5 (ref 5.0–8.0)

## 2012-10-05 LAB — COMPREHENSIVE METABOLIC PANEL
GFR calc Af Amer: >90 mL/min (ref >90)
GFR calc non Af Amer: >90 mL/min (ref >90)
Glucose, Bld: 79 mg/dL (ref 70–99)
Potassium: 3.8 mEq/L (ref 3.5–5.1)
Sodium: 138 mEq/L (ref 135–145)
Total Bilirubin: 0.2 mg/dL — ABNORMAL LOW (ref 0.3–1.2)

## 2012-10-05 NOTE — ED Provider Notes (Signed)
History     CSN: [id number]  Arrival date & time 10/05/12  1122   First MD Initiated Contact with Patient 10/05/12 1321      Chief Complaint  Patient presents with  . Fever    (Consider location/radiation/quality/duration/timing/severity/associated sxs/prior treatment) HPI Comments: Pt states that she was diagnosed with cervical cancer in July and she has had multiple procedures and they are probably going to do a hysterectomy in January:pt states that since august she has had fever up to 102 for  About half of each month:pt states that she has seen her pcp and obgyn and has had nothing show:pt states that she decided to come in because she had a fever again yesterday:pt denies any symptoms besides the fever  Patient is a 26 y.o. female presenting with fever. The history is provided by the patient. No language interpreter was used.  Fever Primary symptoms of the febrile illness include fever. Primary symptoms do not include cough, nausea or vomiting. The current episode started more than 1 week ago. This is a recurrent problem. The problem has not changed since onset.   Past Medical History  Diagnosis Date  . Abnormal Pap smear   . Eclampsia   . Miscarriage 2008, 2009  . Cervical cancer     Past Surgical History  Procedure Date  . Cesarean section 2007, 2011    x  2  . Dilation and curettage of uterus   . Cervical biopsy  w/ loop electrode excision   . Breast enhancement surgery 2008  . Butt implants 2007  . Tonsillectomy 1999  . Wisdom tooth extraction   . Cervical conization w/bx 07/28/2012    Procedure: CONIZATION CERVIX WITH BIOPSY;  Surgeon: [name], MD;  Location: [facility];  Service: Gynecology;  Laterality: N/A;  cold knife cone    Family History  Problem Relation Age of Onset  . Diabetes Maternal Grandfather   . Hypertension Maternal Grandfather     History  Substance Use Topics  . Smoking status: Never Smoker   . Smokeless tobacco: Never Used  .  Alcohol Use: Yes     Comment: Occassionally    OB History    Grav Para Term Preterm Abortions TAB SAB Ect Mult Living   4 2  2 2  2   1       Review of Systems  Constitutional: Positive for fever.  Respiratory: Negative for cough.   Gastrointestinal: Negative for nausea and vomiting.    Allergies  Review of patient's allergies indicates no known allergies.  Home Medications   Current Outpatient Rx  Name  Route  Sig  Dispense  Refill  . IBUPROFEN 200 MG PO TABS   Oral   Take 400 mg by mouth 2 (two) times daily as needed. Headache or cramping         . NORETHIN ACE-ETH ESTRAD-FE 1-20 MG-MCG PO TABS   Oral   Take 1 tablet by mouth daily.         Marland Kitchen PRENATAL MULTIVITAMIN CH   Oral   Take 1 tablet by mouth every evening.           BP 114/70  Pulse 81  Temp 99 F (37.2 C) (Oral)  Resp 16  Ht 5' (1.524 m)  Wt 120 lb (54.432 kg)  BMI 23.44 kg/m2  SpO2 100%  LMP 09/30/2012  Physical Exam  ED Course  Procedures (including critical care time)  Labs Reviewed  CBC WITH DIFFERENTIAL - Abnormal; Notable  for the following:    RDW 11.4 (*)     All other components within normal limits  COMPREHENSIVE METABOLIC PANEL - Abnormal; Notable for the following:    Albumin 3.2 (*)     Total Bilirubin 0.2 (*)     All other components within normal limits  URINALYSIS, ROUTINE W REFLEX MICROSCOPIC  PREGNANCY, URINE   Dg Chest 2 View  10/05/2012  *RADIOLOGY REPORT*  Clinical Data: Fever and fatigue.  Near-syncope.  Visual changes in the right eye.  History of cervical carcinoma.  CHEST - 2 VIEW  Comparison: 05/08/2008.  Findings: Trachea is midline.  Heart size normal.  Lungs are clear. No pleural fluid.  IMPRESSION: No acute findings.   Original Report Authenticated By: Leanna Battles, M.D.      1. Fever       MDM  No reason for the fever:pt has a pcp and gyn and pt is to follow up with them        Teressa Lower, NP 10/05/12 1615

## 2012-10-05 NOTE — ED Provider Notes (Signed)
Medical screening examination/treatment/procedure(s) were performed by non-physician practitioner and as supervising physician I was immediately available for consultation/collaboration.   Carleene Cooper III, MD 10/05/12 [phone]

## 2012-10-05 NOTE — ED Notes (Signed)
C/o intermittent fever, fatigue since August-worse x 5 days-this am pt c/o near syncopal episodes with visual disturbance to right eye

## 2012-12-02 ENCOUNTER — Encounter: Payer: Self-pay | Admitting: Gynecology

## 2012-12-02 ENCOUNTER — Ambulatory Visit: Payer: BC Managed Care – PPO | Attending: Gynecology | Admitting: Gynecology

## 2012-12-02 VITALS — BP 110/70 | HR 66 | Temp 98.3°F | Resp 16 | Ht 60.47 in | Wt 119.2 lb

## 2012-12-02 DIAGNOSIS — C539 Malignant neoplasm of cervix uteri, unspecified: Secondary | ICD-10-CM | POA: Insufficient documentation

## 2012-12-02 NOTE — Progress Notes (Signed)
Consult Note: Gyn-Onc   Melinda Robertson 27 y.o. female  Chief Complaint  Patient presents with  . Cervical Cancer    Follow up    Interval History: Since her last visit the patient underwent a cold knife conization on 07/28/2012. Final pathology showed no evidence of residual disease.  The patient reports and is quite concerned about a persistent fever as she claims she has had ever since her LEEP procedure fevers run in the range of 100.7-100.2 and are nearly daily. Apparently she's undergone some sort workup by her primary care physician (NP at [facility]) which the patient claims was nondiagnostic. (We do not have access to any of those records) she denies any chills flank pain or any constitutional symptoms. She does note a slight amount of pain with sexual intercourse. She also notes she has intermittent spotting despite the fact that she is on cyclic birth control pills.  HPI:HPI: 27 year old white female gravida 3 para 1 initilally seen in consultation at the request of Dr. 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 Patient had an abnormal Pap smear and initially underwent colposcopy and directed biopsy revealing a high-grade squamous lesion. 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 The patient's had an uncomplicated postoperative course.  Patient has a difficult obstetrical history in that she developed severe preeclampsia early in her pregnancy ultimately delivering by emergency classical cesarean section an infant who died on the Mar 23, 2023 of life. She also reports that she's had one or 2 miscarriages. Ultimately she did deliver a healthy daughter who is now 19-1/2 years of age.  The patient and her husband have  discussed future fertility and appear at this time to believe that they would not like to pursue fertility which will have some bearing on decisions regarding possibility of hysterectomy. Because of the positive margin, she underwent a cold knife conization on 07/28/2012. Final pathology showed no evidence of residual disease. The patient had an uncomplicated postoperative course.  Review of Systems:10 point review of systems is negative as noted above.   Vitals: Blood pressure 110/70, pulse 66, temperature 98.3 F (36.8 C), resp. rate 16, height 5' 0.47" (1.536 m), weight 119 lb 3.2 oz (54.069 kg).  Physical Exam: General : The patient is a healthy woman in no acute distress.  HEENT: normocephalic, extraoccular movements normal; neck is supple without thyromegally  Lynphnodes: Supraclavicular and inguinal nodes not enlarged  Abdomen: Soft, non-tender, no ascites, no organomegally, no masses, no hernias  Pelvic:  EGBUS: Normal female  Vagina: Normal, no lesions  Urethra and Bladder: Normal, non-tender  Cervix: Healed following conization. There were no lesions noted  Uterus: Anterior normal shape size and consistency. There is a slight amount of tenderness and thickening in the left vaginal fornix/parametria. Bi-manual examination: Non-tender; no adenxal masses or nodularity    Lower extremities: No edema or varicosities. 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 The patient had previously considered a hysterectomy although that decision is uncertain at this time.  Clearly, she does not choose to have a hysterectomy would recommend continued Pap smear surveillance at six-month intervals for at least 2 years  She is scheduled  to have an ultrasound in Dr. Lisbeth Ply office next week.  At this juncture I am uncertain as to the etiology of  her abnormal bleeding or fever. Certainly if there is no evidence of pelvic infection on ultrasound I recommend she be evaluated by a subspecialist in infectious disease. She has returned to the care of Dr. Arelia Sneddon.  No Known Allergies  Past Medical History  Diagnosis Date  . Abnormal Pap smear   . Eclampsia   . Miscarriage 2008, 2009  . Cervical cancer     Past Surgical History  Procedure Date  . Cesarean section 2007, 2011    x  2  . Dilation and curettage of uterus   . Cervical biopsy  w/ loop electrode excision   . Breast enhancement surgery 2008  . Butt implants 2007  . Tonsillectomy 1999  . Wisdom tooth extraction   . Cervical conization w/bx 07/28/2012    Procedure: CONIZATION CERVIX WITH BIOPSY;  Surgeon: [name], MD;  Location: [facility];  Service: Gynecology;  Laterality: N/A;  cold knife cone    Current Outpatient Prescriptions  Medication Sig Dispense Refill  . ibuprofen (ADVIL,MOTRIN) 200 MG tablet Take 400 mg by mouth 2 (two) times daily as needed. Headache or cramping      . norethindrone-ethinyl estradiol (JUNEL FE,GILDESS FE,LOESTRIN FE) 1-20 MG-MCG tablet Take 1 tablet by mouth daily.      . Prenatal Vit-Fe Fumarate-FA (PRENATAL MULTIVITAMIN) TABS Take 1 tablet by mouth every evening.        History   Social History  . Marital Status: Married    Spouse Name: N/A    Number of Children: N/A  . Years of Education: N/A   Occupational History  . Not on file.   Social History Main Topics  . Smoking status: Never Smoker   . Smokeless tobacco: Never Used  . Alcohol Use: Yes     Comment: Occassionally  . Drug Use: No  . Sexually Active: Yes    Birth Control/ Protection: Pill   Other Topics Concern  . Not on file   Social History Narrative  . No narrative on file    Family History  Problem Relation Age of Onset  . Diabetes Maternal Grandfather   . Hypertension Maternal Grandfather       Jeannette Corpus, MD 12/02/2012, 1:12  PM

## 2012-12-02 NOTE — Patient Instructions (Signed)
Please followup your care in Dr. Lisbeth Ply office.

## 2013-01-09 ENCOUNTER — Encounter (HOSPITAL_COMMUNITY): Payer: Self-pay

## 2013-01-16 ENCOUNTER — Encounter (HOSPITAL_COMMUNITY): Payer: Self-pay

## 2013-01-16 ENCOUNTER — Encounter (HOSPITAL_COMMUNITY)
Admission: RE | Admit: 2013-01-16 | Discharge: 2013-01-16 | Disposition: A | Discharge status: Home / Self Care | Payer: BC Managed Care – PPO | Source: Ambulatory Visit | Attending: Obstetrics and Gynecology | Admitting: Obstetrics and Gynecology

## 2013-01-16 HISTORY — DX: Unspecified convulsions: R56.9

## 2013-01-16 LAB — CBC
MCHC: 35.3 g/dL (ref 30.0–36.0)
RDW: 12.1 % (ref 11.5–15.5)

## 2013-01-16 LAB — SURGICAL PCR SCREEN
MRSA, PCR: INVALID — AB
Staphylococcus aureus: INVALID — AB

## 2013-01-16 NOTE — Patient Instructions (Signed)
Your procedure is scheduled on:01/23/13  Enter through the Main Entrance at :6am Pick up desk phone and dial [phone] and inform us of your arrival.  Please call [phone] if you have any problems the morning of surgery.  Remember: Do not eat or drink after midnight:Sunday   Take these meds the morning of surgery with a sip of water:none  DO NOT wear jewelry, eye make-up, lipstick,body lotion, or dark fingernail polish. Do not shave for 48 hours prior to surgery.  If you are to be admitted after surgery, leave suitcase in car until your room has been assigned. Patients discharged on the day of surgery will not be allowed to drive home.

## 2013-01-18 NOTE — H&P (Signed)
  Patient name  Melinda Robertson. Melinda Robertson DICTATION#  [id number] CSN# [id number]  [name], MD 01/18/2013 7:45 AM

## 2013-01-18 NOTE — H&P (Signed)
NAME:  Melinda Robertson, Melinda Robertson NO.:  [id number]  MEDICAL RECORD NO.:  [id number]  LOCATION:  [facility]                         FACILITY:  [facility]  PHYSICIAN:  [name], M.D.   DATE OF BIRTH:  [birth year]  DATE OF ADMISSION:  12/23/2012 DATE OF DISCHARGE:  01/16/2013                             HISTORY & PHYSICAL   Date of her surgery is March 24 at [facility].  The patient is a 27 year old, gravida 3, para 1-1-1-1 female who presents for laparoscopic-assisted vaginal hysterectomy.  In relation to the present admission, the patient found to have abnormal cervical cytology suggestive of high-grade dysplasia.  She underwent a LEEP of the cervix in August 2013.  Past followed revealed adenocarcinoma in situ of the endocervix and she had high-grade dysplasia externally.  She was seen by Dr. De Blanch, GYN Oncology for consultation. He suggested that she proceed with a cold knife conization of cervix. This was performed in September of last year.  Pathology revealed no residual adenocarcinoma in situ or dysplastic changes.  She then received consultation from Dr. De Blanch concerning further management.  She and her husband decided against any future children and decided to proceed with definitive therapy in the form of laparoscopic- assisted vaginal hysterectomy for which she is admitted at the present time.  ALLERGIES:  She has no known drug allergies.  MEDICATIONS:  None.  PAST MEDICAL HISTORY:  She has usual childhood diseases without any significant sequelae.  PAST SURGICAL HISTORY:  She has had 2 cesarean sections, one D and C. She has also had breast implant.  She has also had the above-noted cold knife conization of cervix.  SOCIAL HISTORY:  No tobacco or alcohol use.  FAMILY HISTORY:  Noncontributory.  REVIEW OF SYSTEMS:  Negative.  PHYSICAL EXAMINATION:  VITAL SIGNS:  The patient is afebrile.  Stable vital signs.  HEENT:  The  patient is normocephalic.  Pupils are equal, round, and reactive to light and accommodation.  Extraocular movements were intact. Sclerae and conjunctivae clear.  Oropharynx clear.  NECK:  Without thyromegaly.  BREASTS:  No discrete masses.  Bilateral implants are noted.  ABDOMEN:  Benign.  Well-healed low-transverse incision.  PELVIC:  Normal external genitalia.  Vaginal mucosa is clear.  Cervix unremarkable.  Uterus normal size, shape, and contour.  Adnexa free of masses or tenderness.  EXTREMITIES:  Trace edema.  NEUROLOGIC:  Grossly within normal limits.  IMPRESSION:  Adenocarcinoma in situ of the cervix.  PLAN:  Options of continuing without doing a hysterectomy have been discussed.  She does have the option for future childbearing and when she is done, hysterectomy should be performed.  She stated that her and her husband come to the final decision that they do not want any further children and wants to proceed with definitive surgery in the form of the LAVH.  Of note, her last Pap smear was normal.  She does understand alternatives.  The risks of surgery have been discussed including the risk of infection.  The risk of hemorrhage that could require transfusion with the risk of AIDS or hepatitis.  Risk of injury to adjacent organs including bladder, bowel, ureters that could  require further exploratory surgery.  Risk of deep venous thrombosis and pulmonary embolus.  The patient does understand the indications, risks, and alternatives.     [name], M.D.     JSM/MEDQ  D:  01/18/2013  T:  01/18/2013  Job:  [id number]

## 2013-01-19 LAB — MRSA CULTURE

## 2013-01-22 ENCOUNTER — Encounter (HOSPITAL_COMMUNITY): Payer: Self-pay | Admitting: Anesthesiology

## 2013-01-22 NOTE — Anesthesia Preprocedure Evaluation (Addendum)
Anesthesia Evaluation  Patient identified by MRN, date of birth, ID band Patient awake    Reviewed: Allergy & Precautions, H&P , NPO status , Patient's Chart, lab work & pertinent test results  Airway Mallampati: II TM Distance: >3 FB Neck ROM: Full    Dental no notable dental hx. (+) Teeth Intact   Pulmonary  breath sounds clear to auscultation  Pulmonary exam normal       Cardiovascular negative cardio ROS  Rhythm:Regular Rate:Normal     Neuro/Psych Seizures -,  Hx/o ecclampsia negative psych ROS   GI/Hepatic negative GI ROS, Neg liver ROS,   Endo/Other  negative endocrine ROS  Renal/GU negative Renal ROS  negative genitourinary   Musculoskeletal negative musculoskeletal ROS (+)   Abdominal   Peds  Hematology negative hematology ROS (+)   Anesthesia Other Findings   Reproductive/Obstetrics Abnormal Pap Smear Adenocarcinoma in situ                          Anesthesia Physical Anesthesia Plan  ASA: II  Anesthesia Plan: General   Post-op Pain Management:    Induction: Intravenous  Airway Management Planned: Oral ETT  Additional Equipment:   Intra-op Plan:   Post-operative Plan: Extubation in OR  Informed Consent: I have reviewed the patients History and Physical, chart, labs and discussed the procedure including the risks, benefits and alternatives for the proposed anesthesia with the patient or authorized representative who has indicated his/her understanding and acceptance.   Dental advisory given  Plan Discussed with: CRNA, Anesthesiologist and Surgeon  Anesthesia Plan Comments:         Anesthesia Quick Evaluation

## 2013-01-23 ENCOUNTER — Encounter (HOSPITAL_COMMUNITY): Payer: Self-pay | Admitting: Anesthesiology

## 2013-01-23 ENCOUNTER — Encounter (HOSPITAL_COMMUNITY)
Admission: RE | Disposition: A | Discharge status: Home / Self Care | Payer: Self-pay | Source: Ambulatory Visit | Attending: Obstetrics and Gynecology

## 2013-01-23 ENCOUNTER — Ambulatory Visit (HOSPITAL_COMMUNITY): Payer: BC Managed Care – PPO | Admitting: Anesthesiology

## 2013-01-23 ENCOUNTER — Observation Stay (HOSPITAL_COMMUNITY)
Admission: RE | Admit: 2013-01-23 | Discharge: 2013-01-24 | Disposition: A | Discharge status: Home / Self Care | Payer: BC Managed Care – PPO | Source: Ambulatory Visit | Attending: Obstetrics and Gynecology | Admitting: Obstetrics and Gynecology

## 2013-01-23 DIAGNOSIS — D069 Carcinoma in situ of cervix, unspecified: Principal | ICD-10-CM | POA: Insufficient documentation

## 2013-01-23 DIAGNOSIS — Z90710 Acquired absence of both cervix and uterus: Secondary | ICD-10-CM

## 2013-01-23 HISTORY — PX: LAPAROSCOPIC ASSISTED VAGINAL HYSTERECTOMY: SHX5398

## 2013-01-23 LAB — CBC
HCT: 35.1 % — ABNORMAL LOW (ref 36.0–46.0)
Hemoglobin: 12.5 g/dL (ref 12.0–15.0)
MCH: 32.8 pg (ref 26.0–34.0)
MCHC: 35.6 g/dL (ref 30.0–36.0)
MCV: 92.1 fL (ref 78.0–100.0)

## 2013-01-23 SURGERY — HYSTERECTOMY, VAGINAL, LAPAROSCOPY-ASSISTED
Anesthesia: General | Site: Abdomen | Wound class: Clean Contaminated

## 2013-01-23 MED ORDER — CEFAZOLIN SODIUM-DEXTROSE 2-3 GM-% IV SOLR
INTRAVENOUS | Status: AC
  Filled 2013-01-23: qty 50

## 2013-01-23 MED ORDER — LACTATED RINGERS IV SOLN
INTRAVENOUS | Status: DC
  Administered 2013-01-23 – 2013-01-24 (×3): via INTRAVENOUS

## 2013-01-23 MED ORDER — LIDOCAINE HCL (CARDIAC) 20 MG/ML IV SOLN
INTRAVENOUS | Status: AC
  Filled 2013-01-23: qty 5

## 2013-01-23 MED ORDER — MIDAZOLAM HCL 5 MG/5ML IJ SOLN
INTRAMUSCULAR | Status: DC | PRN
  Administered 2013-01-23: 2 mg via INTRAVENOUS

## 2013-01-23 MED ORDER — DIPHENHYDRAMINE HCL 50 MG/ML IJ SOLN: 12.5000 mg | Freq: Four times a day (QID) | INTRAMUSCULAR | Status: DC | PRN

## 2013-01-23 MED ORDER — ROCURONIUM BROMIDE 50 MG/5ML IV SOLN
INTRAVENOUS | Status: AC
  Filled 2013-01-23: qty 1

## 2013-01-23 MED ORDER — NEOSTIGMINE METHYLSULFATE 1 MG/ML IJ SOLN
INTRAMUSCULAR | Status: DC | PRN
  Administered 2013-01-23: 3 mg via INTRAVENOUS

## 2013-01-23 MED ORDER — SODIUM CHLORIDE 0.9 % IJ SOLN: 9.0000 mL | INTRAMUSCULAR | Status: DC | PRN

## 2013-01-23 MED ORDER — METOCLOPRAMIDE HCL 5 MG/ML IJ SOLN: 10.0000 mg | Freq: Once | INTRAMUSCULAR | Status: DC | PRN

## 2013-01-23 MED ORDER — GLYCOPYRROLATE 0.2 MG/ML IJ SOLN
INTRAMUSCULAR | Status: AC
  Filled 2013-01-23: qty 3

## 2013-01-23 MED ORDER — KETOROLAC TROMETHAMINE 30 MG/ML IJ SOLN
INTRAMUSCULAR | Status: DC | PRN
  Administered 2013-01-23: 30 mg via INTRAVENOUS

## 2013-01-23 MED ORDER — BUPIVACAINE HCL (PF) 0.25 % IJ SOLN
INTRAMUSCULAR | Status: AC
  Filled 2013-01-23: qty 30

## 2013-01-23 MED ORDER — PROPOFOL 10 MG/ML IV EMUL
INTRAVENOUS | Status: DC | PRN
  Administered 2013-01-23: 170 mg via INTRAVENOUS

## 2013-01-23 MED ORDER — SCOPOLAMINE 1 MG/3DAYS TD PT72
MEDICATED_PATCH | TRANSDERMAL | Status: AC
  Administered 2013-01-23: 1.5 mg via TRANSDERMAL
  Filled 2013-01-23: qty 1

## 2013-01-23 MED ORDER — LIDOCAINE HCL (CARDIAC) 20 MG/ML IV SOLN
INTRAVENOUS | Status: DC | PRN
  Administered 2013-01-23: 50 mg via INTRAVENOUS

## 2013-01-23 MED ORDER — DEXAMETHASONE SODIUM PHOSPHATE 10 MG/ML IJ SOLN
INTRAMUSCULAR | Status: AC
  Filled 2013-01-23: qty 1

## 2013-01-23 MED ORDER — ONDANSETRON HCL 4 MG PO TABS: 4.0000 mg | ORAL_TABLET | Freq: Four times a day (QID) | ORAL | Status: DC | PRN

## 2013-01-23 MED ORDER — LACTATED RINGERS IR SOLN
Status: DC | PRN
  Administered 2013-01-23: 3000 mL

## 2013-01-23 MED ORDER — ROCURONIUM BROMIDE 100 MG/10ML IV SOLN
INTRAVENOUS | Status: DC | PRN
  Administered 2013-01-23: 10 mg via INTRAVENOUS
  Administered 2013-01-23: 40 mg via INTRAVENOUS

## 2013-01-23 MED ORDER — BUPIVACAINE HCL (PF) 0.25 % IJ SOLN
INTRAMUSCULAR | Status: DC | PRN
  Administered 2013-01-23: 4 mL

## 2013-01-23 MED ORDER — OXYCODONE-ACETAMINOPHEN 5-325 MG PO TABS
1.0000 | ORAL_TABLET | ORAL | Status: DC | PRN
  Administered 2013-01-24 (×2): 2 via ORAL
  Administered 2013-01-24: 18:00:00 via ORAL
  Administered 2013-01-24: 2 via ORAL
  Filled 2013-01-23 (×3): qty 2

## 2013-01-23 MED ORDER — FENTANYL CITRATE 0.05 MG/ML IJ SOLN
INTRAMUSCULAR | Status: DC | PRN
  Administered 2013-01-23: 100 ug via INTRAVENOUS
  Administered 2013-01-23: 50 ug via INTRAVENOUS
  Administered 2013-01-23: 100 ug via INTRAVENOUS
  Administered 2013-01-23: 25 ug via INTRAVENOUS
  Administered 2013-01-23: 100 ug via INTRAVENOUS
  Administered 2013-01-23: 50 ug via INTRAVENOUS
  Administered 2013-01-23: 25 ug via INTRAVENOUS

## 2013-01-23 MED ORDER — GLYCOPYRROLATE 0.2 MG/ML IJ SOLN
INTRAMUSCULAR | Status: DC | PRN
  Administered 2013-01-23: 0.4 mg via INTRAVENOUS

## 2013-01-23 MED ORDER — NEOSTIGMINE METHYLSULFATE 1 MG/ML IJ SOLN
INTRAMUSCULAR | Status: AC
  Filled 2013-01-23: qty 1

## 2013-01-23 MED ORDER — MENTHOL 3 MG MT LOZG
1.0000 | LOZENGE | OROMUCOSAL | Status: DC | PRN
  Administered 2013-01-23: 3 mg via ORAL
  Filled 2013-01-23 (×2): qty 9

## 2013-01-23 MED ORDER — ACETAMINOPHEN 325 MG PO TABS: 650.0000 mg | ORAL_TABLET | ORAL | Status: DC | PRN

## 2013-01-23 MED ORDER — ONDANSETRON HCL 4 MG/2ML IJ SOLN: 4.0000 mg | Freq: Four times a day (QID) | INTRAMUSCULAR | Status: DC | PRN

## 2013-01-23 MED ORDER — ZOLPIDEM TARTRATE 5 MG PO TABS: 5.0000 mg | ORAL_TABLET | Freq: Every evening | ORAL | Status: DC | PRN

## 2013-01-23 MED ORDER — NALOXONE HCL 0.4 MG/ML IJ SOLN: 0.4000 mg | INTRAMUSCULAR | Status: DC | PRN

## 2013-01-23 MED ORDER — HYDROMORPHONE HCL PF 1 MG/ML IJ SOLN
INTRAMUSCULAR | Status: AC
  Filled 2013-01-23: qty 1

## 2013-01-23 MED ORDER — BUPIVACAINE-EPINEPHRINE (PF) 0.5% -1:200000 IJ SOLN
INTRAMUSCULAR | Status: AC
  Filled 2013-01-23: qty 10

## 2013-01-23 MED ORDER — ONDANSETRON HCL 4 MG/2ML IJ SOLN
INTRAMUSCULAR | Status: DC | PRN
  Administered 2013-01-23: 4 mg via INTRAVENOUS

## 2013-01-23 MED ORDER — HYDROMORPHONE HCL PF 1 MG/ML IJ SOLN
0.2500 mg | INTRAMUSCULAR | Status: DC | PRN
  Administered 2013-01-23 (×4): 0.25 mg via INTRAVENOUS

## 2013-01-23 MED ORDER — FENTANYL CITRATE 0.05 MG/ML IJ SOLN
INTRAMUSCULAR | Status: AC
  Filled 2013-01-23: qty 5

## 2013-01-23 MED ORDER — DIPHENHYDRAMINE HCL 12.5 MG/5ML PO ELIX
12.5000 mg | ORAL_SOLUTION | Freq: Four times a day (QID) | ORAL | Status: DC | PRN
  Filled 2013-01-23: qty 5

## 2013-01-23 MED ORDER — DEXAMETHASONE SODIUM PHOSPHATE 10 MG/ML IJ SOLN
INTRAMUSCULAR | Status: DC | PRN
  Administered 2013-01-23: 10 mg via INTRAVENOUS

## 2013-01-23 MED ORDER — LACTATED RINGERS IV SOLN
INTRAVENOUS | Status: DC
  Administered 2013-01-23 (×2): via INTRAVENOUS

## 2013-01-23 MED ORDER — CEFAZOLIN SODIUM-DEXTROSE 2-3 GM-% IV SOLR
2.0000 g | INTRAVENOUS | Status: AC
  Administered 2013-01-23: 2 g via INTRAVENOUS

## 2013-01-23 MED ORDER — MIDAZOLAM HCL 2 MG/2ML IJ SOLN
INTRAMUSCULAR | Status: AC
  Filled 2013-01-23: qty 2

## 2013-01-23 MED ORDER — HYDROMORPHONE 0.3 MG/ML IV SOLN
INTRAVENOUS | Status: DC
  Administered 2013-01-23: 22:00:00 via INTRAVENOUS
  Administered 2013-01-23: 2.59 mg via INTRAVENOUS
  Administered 2013-01-23: 0.799 mg via INTRAVENOUS
  Administered 2013-01-23: 2.59 mg via INTRAVENOUS
  Administered 2013-01-23: 10:00:00 via INTRAVENOUS
  Administered 2013-01-24: 0.399 mg via INTRAVENOUS
  Administered 2013-01-24: 1.19 mg via INTRAVENOUS
  Filled 2013-01-23 (×2): qty 25

## 2013-01-23 MED ORDER — ONDANSETRON HCL 4 MG/2ML IJ SOLN
INTRAMUSCULAR | Status: AC
  Filled 2013-01-23: qty 2

## 2013-01-23 MED ORDER — SCOPOLAMINE 1 MG/3DAYS TD PT72: 1.0000 | MEDICATED_PATCH | TRANSDERMAL | Status: DC

## 2013-01-23 MED ORDER — PROPOFOL 10 MG/ML IV EMUL
INTRAVENOUS | Status: AC
  Filled 2013-01-23: qty 20

## 2013-01-23 MED ORDER — MEPERIDINE HCL 25 MG/ML IJ SOLN: 6.2500 mg | INTRAMUSCULAR | Status: DC | PRN

## 2013-01-23 SURGICAL SUPPLY — 37 items
CABLE HIGH FREQUENCY MONO STRZ (ELECTRODE) IMPLANT
CATH ROBINSON RED A/P 16FR (CATHETERS) ×2 IMPLANT
CLOTH BEACON ORANGE TIMEOUT ST (SAFETY) ×2 IMPLANT
CONT PATH 16OZ SNAP LID 3702 (MISCELLANEOUS) ×2 IMPLANT
COVER TABLE BACK 60X90 (DRAPES) ×2 IMPLANT
DECANTER SPIKE VIAL GLASS SM (MISCELLANEOUS) IMPLANT
DERMABOND ADVANCED (GAUZE/BANDAGES/DRESSINGS) ×1
DERMABOND ADVANCED .7 DNX12 (GAUZE/BANDAGES/DRESSINGS) ×1 IMPLANT
ELECT REM PT RETURN 9FT ADLT (ELECTROSURGICAL)
ELECTRODE REM PT RTRN 9FT ADLT (ELECTROSURGICAL) IMPLANT
GLOVE BIO SURGEON STRL SZ7 (GLOVE) ×4 IMPLANT
GLOVE BIOGEL PI IND STRL 6.5 (GLOVE) ×1 IMPLANT
GLOVE BIOGEL PI INDICATOR 6.5 (GLOVE) ×1
GOWN STRL REIN XL XLG (GOWN DISPOSABLE) ×8 IMPLANT
NEEDLE INSUFFLATION 120MM (ENDOMECHANICALS) IMPLANT
NS IRRIG 1000ML POUR BTL (IV SOLUTION) ×2 IMPLANT
PACK LAVH (CUSTOM PROCEDURE TRAY) ×2 IMPLANT
PROTECTOR NERVE ULNAR (MISCELLANEOUS) ×2 IMPLANT
SEALER TISSUE G2 CVD JAW 45CM (ENDOMECHANICALS) ×2 IMPLANT
SET IRRIG TUBING LAPAROSCOPIC (IRRIGATION / IRRIGATOR) IMPLANT
STRIP CLOSURE SKIN 1/4X3 (GAUZE/BANDAGES/DRESSINGS) IMPLANT
SUT MON AB 2-0 CT1 27 (SUTURE) ×4 IMPLANT
SUT VIC AB 0 CT1 18XCR BRD8 (SUTURE) ×2 IMPLANT
SUT VIC AB 0 CT1 27 (SUTURE) ×1
SUT VIC AB 0 CT1 27XBRD ANBCTR (SUTURE) ×1 IMPLANT
SUT VIC AB 0 CT1 36 (SUTURE) ×2 IMPLANT
SUT VIC AB 0 CT1 8-18 (SUTURE) ×2
SUT VICRYL 0 UR6 27IN ABS (SUTURE) IMPLANT
SUT VICRYL 1 TIES 12X18 (SUTURE) ×2 IMPLANT
SUT VICRYL 4-0 PS2 18IN ABS (SUTURE) ×2 IMPLANT
TOWEL OR 17X24 6PK STRL BLUE (TOWEL DISPOSABLE) ×4 IMPLANT
TRAY FOLEY CATH 14FR (SET/KITS/TRAYS/PACK) ×2 IMPLANT
TROCAR BALLN 12MMX100 BLUNT (TROCAR) IMPLANT
TROCAR OPTI TIP 5M 100M (ENDOMECHANICALS) ×2 IMPLANT
TROCAR XCEL DIL TIP R 11M (ENDOMECHANICALS) ×2 IMPLANT
WARMER LAPAROSCOPE (MISCELLANEOUS) ×2 IMPLANT
WATER STERILE IRR 1000ML POUR (IV SOLUTION) IMPLANT

## 2013-01-23 NOTE — H&P (Signed)
  History and physical exam unchanged 

## 2013-01-23 NOTE — Transfer of Care (Signed)
Immediate Anesthesia Transfer of Care Note  Patient: Melinda Robertson  Procedure(s) Performed: Procedure(s): LAPAROSCOPIC ASSISTED VAGINAL HYSTERECTOMY (N/A)  Patient Location: PACU  Anesthesia Type:General  Level of Consciousness: awake  Airway & Oxygen Therapy: Patient Spontanous Breathing  Post-op Assessment: Report given to PACU RN  Post vital signs: stable  There were no vitals filed for this visit.  Complications: No apparent anesthesia complications

## 2013-01-23 NOTE — Op Note (Signed)
NAME:  Melinda Robertson, Melinda Robertson                ACCOUNT NO.:  [id number]  MEDICAL RECORD NO.:  [id number]  LOCATION:  9320                          FACILITY:  [facility]  PHYSICIAN:  [name], M.D.   DATE OF BIRTH:  [birth year]  DATE OF PROCEDURE:  01/23/2013 DATE OF DISCHARGE:                              OPERATIVE REPORT   PREOPERATIVE DIAGNOSIS:  Adenocarcinoma in situ of the endocervix.  POSTOPERATIVE DIAGNOSIS:  Adenocarcinoma in situ of the endocervix.  PROCEDURE:  Laparoscopic-assisted vaginal hysterectomy.  SURGEON:  [name], M.D.  ASSISTANT:  Marcelle Overlie, M.D.  ANESTHESIA:  General endotracheal.  ESTIMATED BLOOD LOSS:  400 mL.  PACKS:  None.  DRAINS:  Urethral Foley intraoperative blood placement.  COMPLICATIONS:  None.  INDICATIONS:  Dictated in history and physical.  PROCEDURE:  The patient was taken to the OR and placed in supine position.  After satisfactory level of general anesthesia was obtained, the patient was placed in dorsal lithotomy position using the Allen stirrups.  The abdomen, perineum, and vagina were prepped out with Betadine.  Bladder was emptied by in and out catheterization.  A Hulka tenaculum was put in place and secured.  The patient was draped in sterile field.  Subumbilical incision made with a knife.  Veress needle was introduced and out catheterization.  Abdomen inflated approximately 3 liters of carbon dioxide.  A 10/11 trocar was put in place in the subumbilical area.  Laparoscope was introduced.  Visualization revealed no evidence of injury to adjacent organs.  A 5-mm trocar was put in place in the suprapubic area under direct visualization.  Appendix was normal.  Upper abdomen including liver tip of the gallbladder were normal.  The uterus, tubes, and ovaries were unremarkable.  Using the EnSeal, we first went to the right adnexa.  The right utero-ovarian pedicle was cauterized and incised.  The right tube and mesosalpinx  were cauterized and incised and the right round ligament was cauterized and incised.  We then went to the left side.  The left utero-ovarian pedicle was cauterized and incised.  The left tube and mesosalpinx were cauterized and incised, and the left round ligament was cauterized and incised.  At this point in time, the abdomen was deflated with carbon dioxide. The laparoscope had been removed.  The patient's legs were repositioned. A Hulka tenaculum was removed.  A weighted speculum was placed in the vaginal vault.  Cul-de-sac was entered sharply.  Both uterosacral ligaments were clamped, cut, and suture ligated with 0 Vicryl.  The reflection of vaginal mucosa anteriorly was incised and the bladder was dissected superiorly.  Paracervical tissue was clamped, cut, and suture ligated with 0 Vicryl.  Vesicouterine space was identified and entered sharply, and retractors put in place.  Using clamp, cut, and tie technique with suture ligature of 0 Vicryl, the parametrium serially separated from the sides of uterus.  Uterus was then flipped.  Remaining pedicles were clamped and cut, and uterus and cervix were passed off the operative field and sent to Pathology.  Held pedicles were secured with free ties of 0 Vicryl.  The artery was bleeding, it was clamped and tied off with a free ties  of 0 Vicryl.  Next, the uterosacral plication stitch of 0 Vicryl was put in place and secured.  The vaginal mucosa was then reapproximated in the midline with interrupted figure-of-eights 3-0 Monocryl.  We had good approximation.  Foley was placed to straight drain with clear urine.  Legs were repositioned.  Abdomen was reinflated with carbon dioxide. Using the bipolar, we cauterized the ovarian vasculature making sure was hemostatic on both sides.  There was some slight oozing from the cath that have also brought under control with the Bovie.  We thoroughly irrigated the pelvis and deflated and looked again  there was no active bleeding.  The abdomen was deflated with carbon dioxide.  All trocars were removed.  Subumbilical incision was closed with interrupted subcuticular 4-0 Vicryl.  Suprapubic incision was closed with Dermabond. The patient was taken out of the dorsal lithotomy position.  Once alert and extubated, transferred to recovery in good condition.  Sponge, instrument, and needle counts were reported as correct by the nurse x2. Foley catheter remained clear at the time of closure.     [name], M.D.     JSM/MEDQ  D:  01/23/2013  T:  01/23/2013  Job:  [id number]

## 2013-01-23 NOTE — Op Note (Signed)
Patient name  Melinda Robertson, Mcroy DICTATION#  [id number] CSN# [id number]  [name], MD 01/23/2013 8:46 AM

## 2013-01-23 NOTE — Anesthesia Postprocedure Evaluation (Signed)
  Anesthesia Post-op Note  Patient: Melinda Robertson  Procedure(s) Performed: Procedure(s): LAPAROSCOPIC ASSISTED VAGINAL HYSTERECTOMY (N/A)  Patient Location: [facility]  Anesthesia Type:General  Level of Consciousness: awake, alert  and oriented  Airway and Oxygen Therapy: Patient Spontanous Breathing and Patient connected to nasal cannula oxygen  Post-op Pain: mild  Post-op Assessment: Post-op Vital signs reviewed and Patient's Cardiovascular Status Stable  Post-op Vital Signs: Reviewed and stable  Complications: No apparent anesthesia complications

## 2013-01-23 NOTE — Anesthesia Postprocedure Evaluation (Signed)
  Anesthesia Post Note  Patient: Melinda Robertson  Procedure(s) Performed: Procedure(s) (LRB): LAPAROSCOPIC ASSISTED VAGINAL HYSTERECTOMY (N/A)  Anesthesia type: GA  Patient location: PACU  Post pain: Pain level controlled  Post assessment: Post-op Vital signs reviewed  Last Vitals:  Filed Vitals:   01/23/13 0915  BP: 101/62  Pulse: 65  Temp:   Resp: 15    Post vital signs: Reviewed  Level of consciousness: sedated  Complications: No apparent anesthesia complications

## 2013-01-23 NOTE — Brief Op Note (Signed)
01/23/2013  8:44 AM  PATIENT:  Melinda Robertson  27 y.o. female  PRE-OPERATIVE DIAGNOSIS:  history of adenocarinoma insitu  POST-OPERATIVE DIAGNOSIS:  history of adenocarinoma insitu  PROCEDURE:  Procedure(s): LAPAROSCOPIC ASSISTED VAGINAL HYSTERECTOMY (N/A)  SURGEON:  Surgeon(s) and Role:    * [name], MD - Primary    * Jeani Hawking, MD - Assisting  PHYSICIAN ASSISTANT:   ASSISTANTS: Dr. Vincente Poli   ANESTHESIA:   general  EBL:  Total I/O In: 300 [I.V.:300] Out: 300 [Urine:100; Blood:200]  BLOOD ADMINISTERED:none  DRAINS: Urinary Catheter (Foley)   LOCAL MEDICATIONS USED:  MARCAINE     SPECIMEN:  Source of Specimen:  uterus  DISPOSITION OF SPECIMEN:  PATHOLOGY  COUNTS:  YES  TOURNIQUET:  * No tourniquets in log *  DICTATION: .Other Dictation: Dictation Number [id number]  PLAN OF CARE: overnight obs  PATIENT DISPOSITION:  PACU - hemodynamically stable.   Delay start of Pharmacological VTE agent (>24hrs) due to surgical blood loss or risk of bleeding: no

## 2013-01-23 NOTE — Progress Notes (Signed)
Patient ID: Melinda Robertson, female   DOB: [birth year], 27 y.o.   MRN: [id number] [employer] clear Good uo Minimal bleeding

## 2013-01-24 ENCOUNTER — Encounter (HOSPITAL_COMMUNITY): Payer: Self-pay | Admitting: Obstetrics and Gynecology

## 2013-01-24 LAB — CBC
HCT: 31.2 % — ABNORMAL LOW (ref 36.0–46.0)
MCH: 32.6 pg (ref 26.0–34.0)
MCHC: 34.3 g/dL (ref 30.0–36.0)
MCV: 95.1 fL (ref 78.0–100.0)
RDW: 12.2 % (ref 11.5–15.5)

## 2013-01-24 MED ORDER — OXYCODONE-ACETAMINOPHEN 7.5-325 MG PO TABS: 1.0000 | ORAL_TABLET | ORAL | Status: DC | PRN

## 2013-01-24 NOTE — Discharge Summary (Signed)
  Patient name  Melinda Robertson, Melinda Robertson DICTATION#[id number] CSN#   [id number]  [name], MD 01/24/2013 8:41 AM

## 2013-01-24 NOTE — Discharge Summary (Signed)
Melinda Robertson, Melinda Robertson                ACCOUNT NO.:  [id number]  MEDICAL RECORD NO.:  [id number]  LOCATION:  9320                          FACILITY:  [facility]  PHYSICIAN:  [name], M.D.   DATE OF BIRTH:  Jan 06, 1986  DATE OF ADMISSION:  01/23/2013 DATE OF DISCHARGE:  01/24/2013                              DISCHARGE SUMMARY   ADMITTING DIAGNOSIS:  Adenocarcinoma in situ of the endocervix.  DISCHARGE DIAGNOSIS:  Adenocarcinoma in situ of the endocervix.  OPERATIVE PROCEDURE:  Laparoscopic-assisted vaginal hysterectomy.  For complete and history, please see dictated note.  COURSE IN THE HOSPITAL:  The patient underwent above-noted surgery. Postop, she did well.  On her first postop day, she was afebrile with stable vital signs.  Abdomen is soft.  Bowel sounds were active.  All incisions were clear.  She had no active vaginal bleeding.  Foley had just been discontinued.  She will be discharged home at this time.  In terms of complications, none were encountered during her stay in the hospital.  The patient discharged home in stable condition.  DISPOSITION:  Routine postop instructions were given to avoid heavy lifting, vaginal entrance, or driving a car.  She is to call with signs of infection in terms of fever.  She will report nausea, vomiting.  Any active bleeding she will report.  Excessive pain.  Signs and symptoms of deep venous thrombosis and pulmonary embolus discussed.  Discharged home with Percocet, she needs for pain.  Plan to follow up in the office in 1 week.     [name], M.D.     JSM/MEDQ  D:  01/24/2013  T:  01/24/2013  Job:  [id number]

## 2013-01-24 NOTE — Progress Notes (Signed)
1 Day Post-Op Procedure(s) (LRB): LAPAROSCOPIC ASSISTED VAGINAL HYSTERECTOMY (N/A)  Subjective: Patient reports incisional pain and tolerating PO.    Objective: I have reviewed patient's vital signs and labs.  General: alert GI: soft, non-tender; bowel sounds normal; no masses,  no organomegaly Vaginal Bleeding: minimal  Assessment: s/p Procedure(s): LAPAROSCOPIC ASSISTED VAGINAL HYSTERECTOMY (N/A): stable  Plan: Discharge home  LOS: 1 day    Melinda Robertson S 01/24/2013, 8:37 AM

## 2013-02-23 ENCOUNTER — Encounter (HOSPITAL_COMMUNITY): Payer: Self-pay | Admitting: *Deleted

## 2013-02-23 ENCOUNTER — Inpatient Hospital Stay (HOSPITAL_COMMUNITY)
Admission: AD | Admit: 2013-02-23 | Discharge: 2013-02-23 | Disposition: A | Discharge status: Home / Self Care | Payer: BC Managed Care – PPO | Source: Ambulatory Visit | Attending: Obstetrics & Gynecology | Admitting: Obstetrics & Gynecology

## 2013-02-23 DIAGNOSIS — M545 Low back pain, unspecified: Secondary | ICD-10-CM | POA: Insufficient documentation

## 2013-02-23 DIAGNOSIS — R109 Unspecified abdominal pain: Secondary | ICD-10-CM | POA: Insufficient documentation

## 2013-02-23 DIAGNOSIS — Z90710 Acquired absence of both cervix and uterus: Secondary | ICD-10-CM | POA: Insufficient documentation

## 2013-02-23 DIAGNOSIS — N390 Urinary tract infection, site not specified: Secondary | ICD-10-CM | POA: Insufficient documentation

## 2013-02-23 LAB — CBC
HCT: 34.3 % — ABNORMAL LOW (ref 36.0–46.0)
MCV: 91.7 fL (ref 78.0–100.0)
Platelets: 137 10*3/uL — ABNORMAL LOW (ref 150–400)
RBC: 3.74 MIL/uL — ABNORMAL LOW (ref 3.87–5.11)
WBC: 3.5 10*3/uL — ABNORMAL LOW (ref 4.0–10.5)

## 2013-02-23 LAB — URINALYSIS, ROUTINE W REFLEX MICROSCOPIC
Bilirubin Urine: NEGATIVE
Hgb urine dipstick: NEGATIVE
Protein, ur: 30 mg/dL — AB
Urobilinogen, UA: 4 mg/dL — ABNORMAL HIGH (ref 0.0–1.0)

## 2013-02-23 LAB — URINE MICROSCOPIC-ADD ON

## 2013-02-23 LAB — COMPREHENSIVE METABOLIC PANEL
AST: 17 U/L (ref 0–37)
Alkaline Phosphatase: 65 U/L (ref 39–117)
BUN: 6 mg/dL (ref 6–23)
CO2: 27 mEq/L (ref 19–32)
Chloride: 102 mEq/L (ref 96–112)
Creatinine, Ser: 0.69 mg/dL (ref 0.50–1.10)
GFR calc non Af Amer: >90 mL/min (ref >90)
Potassium: 3.5 mEq/L (ref 3.5–5.1)
Total Bilirubin: 0.3 mg/dL (ref 0.3–1.2)

## 2013-02-23 MED ORDER — CIPROFLOXACIN HCL 500 MG PO TABS
500.0000 mg | ORAL_TABLET | Freq: Once | ORAL | Status: AC
  Administered 2013-02-23: 500 mg via ORAL
  Filled 2013-02-23: qty 1

## 2013-02-23 MED ORDER — CIPROFLOXACIN HCL 500 MG PO TABS: 500.0000 mg | ORAL_TABLET | Freq: Two times a day (BID) | ORAL | Status: DC

## 2013-02-23 NOTE — MAU Note (Signed)
Pt reports she had a hysterectomy 4 weeks ago due to cervical cancer. Today began having chills, body aches, and burning urination. Also reports pain on left side and lower back pain. Went to [facility] per her doctor's order and urine there showed "a trace of infection" and they told her to come here.

## 2013-02-23 NOTE — MAU Provider Note (Signed)
History     CSN: [id number]  Arrival date and time: 02/23/13 2042   None     Chief Complaint  Patient presents with  . Abdominal Pain  . Fever   HPI  Melinda Robertson is a 27 y.o. [phone] who had a LAVH on 01/23/13. She states that her recovery has been "great". She noticed today that she was having some aches and feeling "not well" and that she had a temp at home of 100.6. She was also having some lower back pain, suprapubic pain and urgency. She called Dr. Arelia Sneddon and he had suggested that she go to UC close to home. She went there, and states that they informed her she had a UTI, but that she needed to be seen here for further evaluation.   Past Medical History  Diagnosis Date  . Abnormal Pap smear   . Eclampsia   . Miscarriage 2008, 2009  . Cervical cancer   . Seizures 2007    with eclampsia    Past Surgical History  Procedure Laterality Date  . Cesarean section  2007, 2011    x  2  . Dilation and curettage of uterus    . Cervical biopsy  w/ loop electrode excision    . Breast enhancement surgery  2008  . Butt implants  2007  . Tonsillectomy  1999  . Wisdom tooth extraction    . Cervical conization w/bx  07/28/2012    Procedure: CONIZATION CERVIX WITH BIOPSY;  Surgeon: [name], MD;  Location: [facility];  Service: Gynecology;  Laterality: N/A;  cold knife cone  . Laparoscopic assisted vaginal hysterectomy N/A 01/23/2013    Procedure: LAPAROSCOPIC ASSISTED VAGINAL HYSTERECTOMY;  Surgeon: [name], MD;  Location: [facility];  Service: Gynecology;  Laterality: N/A;    Family History  Problem Relation Age of Onset  . Diabetes Maternal Grandfather   . Hypertension Maternal Grandfather     History  Substance Use Topics  . Smoking status: Never Smoker   . Smokeless tobacco: Never Used  . Alcohol Use: Yes     Comment: Occassionally    Allergies: No Known Allergies  Prescriptions prior to admission  Medication Sig Dispense Refill  . ibuprofen (ADVIL,MOTRIN) 200  MG tablet Take 400-600 mg by mouth 2 (two) times daily as needed. Headache or cramping      . oxyCODONE-acetaminophen (PERCOCET) 7.5-325 MG per tablet Take 1 tablet by mouth every 4 (four) hours as needed for pain.  40 tablet  0  . Prenatal Vit-Fe Fumarate-FA (PRENATAL MULTIVITAMIN) TABS Take 1 tablet by mouth every evening.        Review of Systems  Constitutional: Positive for fever (100.6 at home). Negative for chills.  Eyes: Negative for blurred vision.  Respiratory: Negative for shortness of breath.   Cardiovascular: Positive for chest pain.  Gastrointestinal: Negative for nausea, vomiting, abdominal pain, diarrhea and constipation.  Genitourinary: Positive for dysuria, urgency, frequency and flank pain.  Musculoskeletal: Negative for myalgias.  Neurological: Negative for dizziness and headaches.   Physical Exam   Blood pressure 107/82, pulse 89, temperature 98.6 F (37 C), temperature source Oral, resp. rate 18, height 5' (1.524 m), weight 54.885 kg (121 lb), last menstrual period 12/27/2012, SpO2 100.00%.  Physical Exam  Nursing note and vitals reviewed. Constitutional: She is oriented to person, place, and time. She appears well-developed and well-nourished. No distress.  Cardiovascular: Normal rate.   Respiratory: Effort normal.  GI: Soft. She exhibits no distension. There is  no tenderness.  laparoscopic incision is well healed.   Genitourinary:  No CVA tenderness  Neurological: She is alert and oriented to person, place, and time.  Skin: Skin is warm and dry.  Psychiatric: She has a normal mood and affect.    MAU Course  Procedures  Results for orders placed during the hospital encounter of 02/23/13 (from the past 24 hour(s))  URINALYSIS, ROUTINE W REFLEX MICROSCOPIC     Status: Abnormal   Collection Time    02/23/13  9:01 PM      Result Value Range   Color, Urine ORANGE (*) YELLOW   APPearance CLEAR  CLEAR   Specific Gravity, Urine <1.005 (*) 1.005 - 1.030   pH  5.0  5.0 - 8.0   Glucose, UA 100 (*) NEGATIVE mg/dL   Hgb urine dipstick NEGATIVE  NEGATIVE   Bilirubin Urine NEGATIVE  NEGATIVE   Ketones, ur NEGATIVE  NEGATIVE mg/dL   Protein, ur 30 (*) NEGATIVE mg/dL   Urobilinogen, UA 4.0 (*) 0.0 - 1.0 mg/dL   Nitrite POSITIVE (*) NEGATIVE   Leukocytes, UA TRACE (*) NEGATIVE  URINE MICROSCOPIC-ADD ON     Status: None   Collection Time    02/23/13  9:01 PM      Result Value Range   Squamous Epithelial / LPF RARE  RARE   WBC, UA 0-2  <3 WBC/hpf   RBC / HPF 0-2  <3 RBC/hpf   Bacteria, UA RARE  RARE  CBC     Status: Abnormal   Collection Time    02/23/13  9:45 PM      Result Value Range   WBC 3.5 (*) 4.0 - 10.5 K/uL   RBC 3.74 (*) 3.87 - 5.11 MIL/uL   Hemoglobin 12.1  12.0 - 15.0 g/dL   HCT 40.9 (*) 81.1 - 91.4 %   MCV 91.7  78.0 - 100.0 fL   MCH 32.4  26.0 - 34.0 pg   MCHC 35.3  30.0 - 36.0 g/dL   RDW 78.2  95.6 - 21.3 %   Platelets 137 (*) 150 - 400 K/uL    2215: Spoke with Dr. Langston Masker, RX Cipro 500mg  BID X 7 days Assessment and Plan   1. UTI (lower urinary tract infection)    RX Cipro 500 mg bid X 7 day First dose given here in MAU FU with Dr. Arelia Sneddon as needed  Tawnya Crook 02/23/2013, 10:26 PM

## 2013-09-06 ENCOUNTER — Encounter (HOSPITAL_COMMUNITY): Payer: Self-pay | Admitting: Emergency Medicine

## 2013-09-06 ENCOUNTER — Emergency Department (HOSPITAL_COMMUNITY)
Admission: EM | Admit: 2013-09-06 | Discharge: 2013-09-06 | Disposition: A | Discharge status: Home / Self Care | Payer: BC Managed Care – PPO | Attending: Emergency Medicine | Admitting: Emergency Medicine

## 2013-09-06 ENCOUNTER — Emergency Department (HOSPITAL_COMMUNITY): Payer: BC Managed Care – PPO

## 2013-09-06 DIAGNOSIS — N83201 Unspecified ovarian cyst, right side: Secondary | ICD-10-CM

## 2013-09-06 DIAGNOSIS — Z90710 Acquired absence of both cervix and uterus: Secondary | ICD-10-CM | POA: Insufficient documentation

## 2013-09-06 DIAGNOSIS — Z79899 Other long term (current) drug therapy: Secondary | ICD-10-CM | POA: Insufficient documentation

## 2013-09-06 DIAGNOSIS — N83209 Unspecified ovarian cyst, unspecified side: Secondary | ICD-10-CM | POA: Insufficient documentation

## 2013-09-06 DIAGNOSIS — R634 Abnormal weight loss: Secondary | ICD-10-CM | POA: Insufficient documentation

## 2013-09-06 DIAGNOSIS — R350 Frequency of micturition: Secondary | ICD-10-CM | POA: Insufficient documentation

## 2013-09-06 DIAGNOSIS — R3915 Urgency of urination: Secondary | ICD-10-CM | POA: Insufficient documentation

## 2013-09-06 DIAGNOSIS — Z8541 Personal history of malignant neoplasm of cervix uteri: Secondary | ICD-10-CM | POA: Insufficient documentation

## 2013-09-06 DIAGNOSIS — R110 Nausea: Secondary | ICD-10-CM | POA: Insufficient documentation

## 2013-09-06 LAB — URINALYSIS, ROUTINE W REFLEX MICROSCOPIC
Glucose, UA: NEGATIVE mg/dL
Hgb urine dipstick: NEGATIVE
Ketones, ur: NEGATIVE mg/dL
Protein, ur: NEGATIVE mg/dL
Urobilinogen, UA: 0.2 mg/dL (ref 0.0–1.0)

## 2013-09-06 MED ORDER — OXYCODONE-ACETAMINOPHEN 5-325 MG PO TABS: ORAL_TABLET | ORAL | Status: DC

## 2013-09-06 MED ORDER — OXYCODONE-ACETAMINOPHEN 5-325 MG PO TABS
2.0000 | ORAL_TABLET | Freq: Once | ORAL | Status: AC
  Administered 2013-09-06: 2 via ORAL
  Filled 2013-09-06: qty 2

## 2013-09-06 MED ORDER — OXYCODONE-ACETAMINOPHEN 5-325 MG PO TABS
1.0000 | ORAL_TABLET | Freq: Once | ORAL | Status: AC
  Administered 2013-09-06: 1 via ORAL
  Filled 2013-09-06: qty 1

## 2013-09-06 MED ORDER — KETOROLAC TROMETHAMINE 60 MG/2ML IM SOLN
60.0000 mg | Freq: Once | INTRAMUSCULAR | Status: AC
  Administered 2013-09-06: 60 mg via INTRAMUSCULAR
  Filled 2013-09-06: qty 2

## 2013-09-06 NOTE — ED Provider Notes (Signed)
Medical screening examination/treatment/procedure(s) were conducted as a shared visit with non-physician practitioner(s) and myself.  I personally evaluated the patient during the encounter.  EKG Interpretation   None       27 yo female with left low abdominal/pelvic pain.  On my exam, well appearing, stable vitals, no distress, low left abdominal/pelvic tenderness without R/R/G.  [place] negative.  I don't think further ED workup needed at this time, advised close follow up for repeat abd exam.    Clinical Impression: 1. Abdominal pain, lower, left   2. Right ovarian cyst       Candyce Churn, MD 09/06/13 [phone]

## 2013-09-06 NOTE — ED Notes (Addendum)
Pt presents to ed with c/o left side lower abdominal/pelvic pain. Pt sts had hysterectomy in March d/t cervical cancer. Pt also reports abnormal vaginal discharge. Pt sts she noticed her left lower abdomen is swollen. Pt also sts pain in left lower back.

## 2013-09-06 NOTE — ED Provider Notes (Signed)
Medical screening examination/treatment/procedure(s) were conducted as a shared visit with non-physician practitioner(s) and myself.  I personally evaluated the patient during the encounter.   Please see my separate note.     Candyce Churn, MD 09/06/13 [phone]

## 2013-09-06 NOTE — ED Provider Notes (Signed)
CSN: [id number]     Arrival date & time 09/06/13  0808 History   First MD Initiated Contact with Patient 09/06/13 [phone]     Chief Complaint  Patient presents with  . Pelvic Pain   (Consider location/radiation/quality/duration/timing/severity/associated sxs/prior Treatment) HPI Pt is a 27yo female with hx of cervical cancer and partial hysterectomy in March 2014.  C/o 2 day onset of gradually worsening left sided pelvic pain that radiates into her back and some down left buttock and leg.  Pain is constant, aching, throbbing, 6/10, worse with certain movements.  Reports pain was so intense last night it brought her to tears and cause nausea but no vomiting or diarrhea. Denies fevers. Reports "easy" recovery from hysterectomy in March, no complications.  Denies hx of similar pain. Has tried ibuprofen at home with minimal relief.  States it also becomes very intense when she bears down to use the restroom, "like a knife twisting in my stomach."  Reports unplanned weight loss as she has noticed her clothes fitting looser but unsure the time frame or amount of weight loss.  Past Medical History  Diagnosis Date  . Abnormal Pap smear   . Eclampsia   . Miscarriage 2008, 2009  . Cervical cancer   . Seizures 2007    with eclampsia   Past Surgical History  Procedure Laterality Date  . Cesarean section  2007, 2011    x  2  . Dilation and curettage of uterus    . Cervical biopsy  w/ loop electrode excision    . Breast enhancement surgery  2008  . Butt implants  2007  . Tonsillectomy  1999  . Wisdom tooth extraction    . Cervical conization w/bx  07/28/2012    Procedure: CONIZATION CERVIX WITH BIOPSY;  Surgeon: [name], MD;  Location: [facility];  Service: Gynecology;  Laterality: N/A;  cold knife cone  . Laparoscopic assisted vaginal hysterectomy N/A 01/23/2013    Procedure: LAPAROSCOPIC ASSISTED VAGINAL HYSTERECTOMY;  Surgeon: [name], MD;  Location: [facility];  Service: Gynecology;   Laterality: N/A;   Family History  Problem Relation Age of Onset  . Diabetes Maternal Grandfather   . Hypertension Maternal Grandfather    History  Substance Use Topics  . Smoking status: Never Smoker   . Smokeless tobacco: Never Used  . Alcohol Use: Yes     Comment: Occassionally- 1x  /mth   OB History   Grav Para Term Preterm Abortions TAB SAB Ect Mult Living   4 2  2 2  2   1      Review of Systems  Constitutional: Positive for unexpected weight change ( clothes fitting looser). Negative for fever, chills, diaphoresis and fatigue.  Gastrointestinal: Positive for abdominal pain. Negative for nausea, vomiting and diarrhea.  Genitourinary: Positive for urgency, frequency, vaginal discharge ( "clear") and pelvic pain. Negative for dysuria, vaginal bleeding and vaginal pain.  All other systems reviewed and are negative.    Allergies  Review of patient's allergies indicates no known allergies.  Home Medications   Current Outpatient Rx  Name  Route  Sig  Dispense  Refill  . ibuprofen (ADVIL,MOTRIN) 200 MG tablet   Oral   Take 400-600 mg by mouth 2 (two) times daily as needed for mild pain or moderate pain. Headache or cramping         . Multiple Vitamins-Minerals (MULTIVITAMIN WITH MINERALS) tablet   Oral   Take 1 tablet by mouth daily.         Marland Kitchen  OVER THE COUNTER MEDICATION   Oral   Take 1 tablet by mouth 2 (two) times daily.         Marland Kitchen oxyCODONE-acetaminophen (PERCOCET/ROXICET) 5-325 MG per tablet      Take 1-2 pills every 4-6 hours as needed for pain.   15 tablet   0    BP 135/70  Pulse 85  Temp(Src) 98.6 F (37 C) (Oral)  Resp 18  SpO2 100%  LMP 12/27/2012 Physical Exam  Nursing note and vitals reviewed. Constitutional: She appears well-developed and well-nourished. No distress.  HENT:  Head: Normocephalic and atraumatic.  Eyes: Conjunctivae are normal. No scleral icterus.  Neck: Normal range of motion.  Cardiovascular: Normal rate, regular rhythm  and normal heart sounds.   Pulmonary/Chest: Effort normal and breath sounds normal. No respiratory distress. She has no wheezes. She has no rales. She exhibits no tenderness.  Abdominal: Soft. Bowel sounds are normal. She exhibits no distension and no mass. There is tenderness (LLQ). There is no rebound, no guarding and no CVA tenderness.  Genitourinary: No labial fusion. There is no rash, tenderness, lesion or injury on the right labia. There is no rash, tenderness, lesion or injury on the left labia. Cervix exhibits no motion tenderness, no discharge and no friability. Right adnexum displays no mass, no tenderness and no fullness. Left adnexum displays tenderness. Left adnexum displays no mass and no fullness. No erythema, tenderness or bleeding around the vagina. No foreign body around the vagina. No signs of injury around the vagina. No vaginal discharge found.  Chaperone present.  No vaginal discharge. No CMT, right adnexal tenderness or mass.  Left adnexal tenderness w/o mass  Musculoskeletal: Normal range of motion.  Neurological: She is alert.  Skin: Skin is warm and dry. She is not diaphoretic.    ED Course  Procedures (including critical care time) Labs Review Labs Reviewed  URINALYSIS, ROUTINE W REFLEX MICROSCOPIC   Imaging Review US Transvaginal Non-ob  09/06/2013   CLINICAL DATA:  Pelvic pain  EXAM: TRANSABDOMINAL AND TRANSVAGINAL ULTRASOUND OF PELVIS  TECHNIQUE: Both transabdominal and transvaginal ultrasound examinations of the pelvis were performed. Transabdominal technique was performed for global imaging of the pelvis including uterus, ovaries, adnexal regions, and pelvic cul-de-sac. It was necessary to proceed with endovaginal exam following the transabdominal exam to visualize the ovaries to better advantage.  COMPARISON:  None  FINDINGS: Uterus  Uterus is surgically absent  Right ovary  Measurements: 5 cm x 3.5 cm x 3.1 cm. There is a complex cyst within the right ovary  measuring 2.1 cm in greatest dimension consistent with a hemorrhagic corpus luteal or follicular cyst. There other simple appearing follicular cysts. No adnexal mass.  Left ovary  Measurements: 3.1 cm x 1.6 cm x 1.6 cm. Normal appearance/no adnexal mass.  Other findings  Trace, physiologic, free fluid.  IMPRESSION: 1. No acute finding. No findings to explain the patient's left lower quadrant pain. 2. Status post hysterectomy. 3. 2.1 cm complex right ovarian cyst likely a hemorrhagic corpus luteum or follicular cyst. This does not warrant further evaluation. 4. No other abnormalities.   Electronically Signed   By: Amie Portland M.D.   On: 09/06/2013 13:06   US Pelvis Complete  09/06/2013   CLINICAL DATA:  Left lower quadrant pain, history of hysterectomy, history cervical cancer  EXAM: TRANSABDOMINAL AND TRANSVAGINAL ULTRASOUND OF PELVIS  TECHNIQUE: Both transabdominal and transvaginal ultrasound examinations of the pelvis were performed. Transabdominal technique was performed for global imaging of the pelvis  including uterus, ovaries, adnexal regions, and pelvic cul-de-sac. It was necessary to proceed with endovaginal exam following the transabdominal exam to visualize the right and left ovaries.  COMPARISON:  None  FINDINGS: Uterus  Measurements: Surgically absent.  Endometrium  Thickness: Surgically absent.  Right ovary  Measurements: 5 x 3.5 x 3.1 cm. Enlarged containing multiple cystic structures and a dominant complex appearing cyst measuring 2.1 x 1.2 x 1.3 cm  Left ovary  Measurements: 3.1 x 1.6 x 1.6 cm. Normal appearance/no adnexal mass.  Other findings  No free fluid.  IMPRESSION: 1. Multiple cysts versus follicles within the right ovary. Dominant 2 cm complex cyst appreciated differential considerations hemorrhagic cyst versus proteinaceous cysts. Surveillance evaluation of the right ovary recommended in 6-8 weeks. 2. No further sonographic abnormalities   Electronically Signed   By: Salome Holmes  M.D.   On: 09/06/2013 11:57    EKG Interpretation   None       MDM   1. Abdominal pain, lower, left   2. Right ovarian cyst    Pt c/o left sided pelvic pain, hx of partial hysterectomy due to cervical cancer.  Denies fever, n/v/d. Pelvic exam: no vaginal discharge, tenderness in left adnexa w/o mass. No CMT. No right adnexal tenderness or mass.  Pelvic U/S: right ovary-multiple cysts vs follicles within right ovary.  Left ovary (area of pt's pain): normal appearance/no adnexal mass.  Pt's pain improved with Toradol and percocet.  No obvious source of pt's pain.  Vitals: unremarkable. Do not believe further workup in the emergency department is warranted at this time. Advised pt to f/u with her GYN for left sided pelvic pain.  Also advised to f/u with her PCP for further evaluation of reported weight loss.  Pt verbalized understanding and agreement with tx plan.    Discussed pt with Dr. Loretha Stapler who also examined pt, agrees with tx plan.     Junius Finner, PA-C 09/06/13 1524

## 2014-04-20 ENCOUNTER — Other Ambulatory Visit: Payer: Self-pay | Admitting: Gastroenterology

## 2014-04-20 DIAGNOSIS — R1032 Left lower quadrant pain: Secondary | ICD-10-CM

## 2014-04-24 ENCOUNTER — Ambulatory Visit
Admission: RE | Admit: 2014-04-24 | Discharge: 2014-04-24 | Disposition: A | Discharge status: Home / Self Care | Payer: BC Managed Care – PPO | Source: Ambulatory Visit | Attending: Gastroenterology | Admitting: Gastroenterology

## 2014-04-24 ENCOUNTER — Encounter (INDEPENDENT_AMBULATORY_CARE_PROVIDER_SITE_OTHER): Payer: Self-pay

## 2014-04-24 DIAGNOSIS — R1032 Left lower quadrant pain: Secondary | ICD-10-CM

## 2014-04-24 MED ORDER — IOHEXOL 300 MG/ML  SOLN
100.0000 mL | Freq: Once | INTRAMUSCULAR | Status: AC | PRN
  Administered 2014-04-24: 100 mL via INTRAVENOUS

## 2014-04-25 ENCOUNTER — Other Ambulatory Visit: Payer: BC Managed Care – PPO

## 2014-04-26 ENCOUNTER — Other Ambulatory Visit: Payer: BC Managed Care – PPO

## 2014-09-03 ENCOUNTER — Encounter (HOSPITAL_COMMUNITY): Payer: Self-pay | Admitting: Emergency Medicine

## 2015-02-28 ENCOUNTER — Other Ambulatory Visit: Payer: Self-pay | Admitting: Obstetrics and Gynecology

## 2015-03-01 LAB — CYTOLOGY - PAP

## 2016-11-13 DIAGNOSIS — S46811A Strain of other muscles, fascia and tendons at shoulder and upper arm level, right arm, initial encounter: Secondary | ICD-10-CM | POA: Insufficient documentation

## 2016-11-13 DIAGNOSIS — M25511 Pain in right shoulder: Secondary | ICD-10-CM | POA: Insufficient documentation

## 2016-11-13 DIAGNOSIS — M25519 Pain in unspecified shoulder: Secondary | ICD-10-CM | POA: Insufficient documentation

## 2017-06-07 ENCOUNTER — Telehealth: Payer: Self-pay | Admitting: Gynecologic Oncology

## 2017-06-07 NOTE — Telephone Encounter (Signed)
Returned call to patient.  She had left a message requesting an appt.  It appears she was last seen in 2014.  Message left asking her to please call the office to arrange appt.

## 2017-06-16 ENCOUNTER — Ambulatory Visit: Payer: Self-pay | Admitting: Gynecologic Oncology

## 2017-06-21 ENCOUNTER — Encounter: Payer: Self-pay | Admitting: Gynecologic Oncology

## 2017-06-21 ENCOUNTER — Ambulatory Visit: Payer: BLUE CROSS/BLUE SHIELD | Attending: Gynecologic Oncology | Admitting: Gynecologic Oncology

## 2017-06-21 ENCOUNTER — Encounter (INDEPENDENT_AMBULATORY_CARE_PROVIDER_SITE_OTHER): Payer: Self-pay

## 2017-06-21 VITALS — BP 130/78 | HR 97 | Temp 98.2°F | Resp 20 | Ht 60.71 in | Wt 130.7 lb

## 2017-06-21 DIAGNOSIS — R140 Abdominal distension (gaseous): Secondary | ICD-10-CM

## 2017-06-21 DIAGNOSIS — R109 Unspecified abdominal pain: Secondary | ICD-10-CM | POA: Diagnosis not present

## 2017-06-21 DIAGNOSIS — G8929 Other chronic pain: Secondary | ICD-10-CM | POA: Diagnosis not present

## 2017-06-21 DIAGNOSIS — N941 Unspecified dyspareunia: Secondary | ICD-10-CM

## 2017-06-21 DIAGNOSIS — D069 Carcinoma in situ of cervix, unspecified: Secondary | ICD-10-CM

## 2017-06-21 DIAGNOSIS — R102 Pelvic and perineal pain: Secondary | ICD-10-CM | POA: Insufficient documentation

## 2017-06-21 DIAGNOSIS — Z90710 Acquired absence of both cervix and uterus: Secondary | ICD-10-CM | POA: Insufficient documentation

## 2017-06-21 DIAGNOSIS — Z8541 Personal history of malignant neoplasm of cervix uteri: Secondary | ICD-10-CM | POA: Diagnosis not present

## 2017-06-21 DIAGNOSIS — Z801 Family history of malignant neoplasm of trachea, bronchus and lung: Secondary | ICD-10-CM | POA: Diagnosis not present

## 2017-06-21 DIAGNOSIS — Z86001 Personal history of in-situ neoplasm of cervix uteri: Secondary | ICD-10-CM | POA: Diagnosis not present

## 2017-06-21 DIAGNOSIS — M25551 Pain in right hip: Secondary | ICD-10-CM

## 2017-06-21 NOTE — Patient Instructions (Signed)
Dr. Denman George will call you with the results of the [place].

## 2017-06-21 NOTE — Progress Notes (Signed)
Consult Note: Gyn-Onc  Consult was requested by Dr. Radene Knee for the evaluation of Melinda Robertson 31 y.o. female  CC:  Chief Complaint  Patient presents with  . Abdominal pain, unspecified abdominal location    Assessment/Plan:  Melinda Robertson  is a 31 y.o.  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  I discussed that it is extremely rare for cervical cancer to pass to the ovaries, and that her cervical condition was PRE malignant and not malignant, therefore there is no risk for metastatic cervical cancer in the ovaries. I discussed that her pain may be due to ovarian pathology, and we can assess that with a TVUS. If it is found, she could be considered for a surgical procedure.  If the ovaries are grossly normal, I would not recommend surgery as her pain is possibly secondary to adhesive disease and chronic pelvic pain and surgical interventions are unlikely to offer long term success. I recommend against BSO in a woman of such a young age who is at average risk for developing ovarian cancer.  If her ovaries appear grossly normal, I would recommend continuous OCP's to see if this improves some of her symptoms.  I am recommending annual pap surveillance of the vagina to detect new HPV-related disease, until at least 2034 but otherwise no specific follow-up.   HPI: Melinda Robertson is a 31 year old woman seen in consultation at the request of Dr Radene Knee for a history of AIS and pelvic pain.  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 She then underwent CKC on 07/28/2012 which showed no residual disease.   She elected for definitive surgery with hysterectomy in 2014 which also showed a benign cervix with no residual dysplasia and no cancer.  The patient has had normal paps since that time and is HPV negative on most recent assessment.  Approximately 1 year postop she began developing central and  right pelvic pain. It is associated with bloating and low back discomfort. It is associated with dyspareunia that is particularly bothersome. It is relieved with tylenol.   She has a family history of lung cancer but no breast or ovarian cancers.  She has had a cesarean section and hysterectomy (LAVH). The patient had a complex obstetric history which included the loss of a 29 week neonate from prematurity and complications of ecclampsia.   When asked about her history she reports that she had cervical cancer, though, review of her pathology shows that she never had invasive cervical cancer on any specimen, and this may reflect a misunderstanding with respect to the nature of her underlying disease.   Current Meds:  Outpatient Encounter Prescriptions as of 06/21/2017  Medication Sig  . ibuprofen (ADVIL,MOTRIN) 200 MG tablet Take 400-600 mg by mouth 2 (two) times daily as needed for mild pain or moderate pain. Headache or cramping  . Multiple Vitamins-Minerals (MULTIVITAMIN WITH MINERALS) tablet Take 1 tablet by mouth daily.  . [DISCONTINUED] OVER THE COUNTER MEDICATION Take 1 tablet by mouth 2 (two) times daily.  . [DISCONTINUED] oxyCODONE-acetaminophen (PERCOCET/ROXICET) 5-325 MG per tablet Take 1-2 pills every 4-6 hours as needed for pain.   No facility-administered encounter medications on file as of 06/21/2017.     Allergy:  Allergies  Allergen Reactions  . Hydrocodone Other (See Comments)  . Oxycodone-Acetaminophen Other (See Comments)    Social Hx:   Social History   Social  History  . Marital status: Married    Spouse name: N/A  . Number of children: N/A  . Years of education: N/A   Occupational History  . Not on file.   Social History Main Topics  . Smoking status: Never Smoker  . Smokeless tobacco: Never Used  . Alcohol use Yes     Comment: Occassionally- 1x  /mth  . Drug use: No  . Sexual activity: Yes    Birth control/ protection: Pill   Other Topics Concern   . Not on file   Social History Narrative  . No narrative on file    Past Surgical Hx:  Past Surgical History:  Procedure Laterality Date  . BREAST ENHANCEMENT SURGERY  2008  . butt implants  2007  . CERVICAL BIOPSY  W/ LOOP ELECTRODE EXCISION    . CERVICAL CONIZATION W/BX  07/28/2012   Procedure: CONIZATION CERVIX WITH BIOPSY;  Surgeon: Darlyn Chamber, MD;  Location: [facility];  Service: Gynecology;  Laterality: N/A;  cold knife cone  . CESAREAN SECTION  2007, 2011   x  2  . DILATION AND CURETTAGE OF UTERUS    . LAPAROSCOPIC ASSISTED VAGINAL HYSTERECTOMY N/A 01/23/2013   Procedure: LAPAROSCOPIC ASSISTED VAGINAL HYSTERECTOMY;  Surgeon: Darlyn Chamber, MD;  Location: [facility];  Service: Gynecology;  Laterality: N/A;  . TONSILLECTOMY  1999  . WISDOM TOOTH EXTRACTION      Past Medical Hx:  Past Medical History:  Diagnosis Date  . Abnormal Pap smear   . Cervical cancer ([place])   . Eclampsia   . Miscarriage 2008, 2009  . Seizures ([place]) 2007   with eclampsia    Past Gynecological History:  Adenocarcinoma in situ of cervix. S/p hysterectomy 2014 Patient's last menstrual period was 12/27/2012.  Family Hx:  Family History  Problem Relation Age of Onset  . Diabetes Maternal Grandfather   . Hypertension Maternal Grandfather     Review of Systems:  Constitutional  Feels well,    ENT Normal appearing ears and nares bilaterally Skin/Breast  No rash, sores, jaundice, itching, dryness Cardiovascular  No chest pain, shortness of breath, or edema  Pulmonary  No cough or wheeze.  Gastro Intestinal  No nausea, vomitting, or diarrhoea. No bright red blood per rectum, no abdominal pain, change in bowel movement, or constipation.  Genito Urinary  No frequency, urgency, dysuria, + pelvic pain Musculo Skeletal  No myalgia, arthralgia, joint swelling or pain  Neurologic  No weakness, numbness, change in gait,  Psychology  No depression, anxiety, insomnia.   Vitals:  Blood pressure  130/78, pulse 97, temperature 98.2 F (36.8 C), temperature source Oral, resp. rate 20, height 5' 0.71" (1.542 m), weight 130 lb 11.2 oz (59.3 kg), last menstrual period 12/27/2012, SpO2 99 %.  Physical Exam: WD in NAD Neck  Supple NROM, without any enlargements.  Lymph Node Survey No cervical supraclavicular or inguinal adenopathy Cardiovascular  Pulse normal rate, regularity and rhythm. S1 and S2 normal.  Lungs  Clear to auscultation bilateraly, without wheezes/crackles/rhonchi. Good air movement.  Skin  No rash/lesions/breakdown  Psychiatry  Alert and oriented to person, place, and time  Abdomen  Normoactive bowel sounds, abdomen soft, non-tender and thin without evidence of hernia.  Back No CVA tenderness Genito Urinary  Vulva/vagina: Normal external female genitalia.   No lesions. No discharge or bleeding.  Bladder/urethra:  No lesions or masses, well supported bladder  Vagina: normal, no lesions.  Cervix and uterus surgically absent  Adnexa: no palpable masses,  there is some right adnexal tenderness. Rectal  deferred Extremities  No bilateral cyanosis, clubbing or edema.   Donaciano Eva, MD  06/21/2017, 1:50 PM

## 2017-06-25 ENCOUNTER — Ambulatory Visit (HOSPITAL_COMMUNITY)
Admission: RE | Admit: 2017-06-25 | Discharge: 2017-06-25 | Disposition: A | Discharge status: Home / Self Care | Payer: BLUE CROSS/BLUE SHIELD | Source: Ambulatory Visit | Attending: Gynecologic Oncology | Admitting: Gynecologic Oncology

## 2017-06-25 DIAGNOSIS — M25551 Pain in right hip: Secondary | ICD-10-CM | POA: Insufficient documentation

## 2017-06-25 DIAGNOSIS — R102 Pelvic and perineal pain: Secondary | ICD-10-CM | POA: Diagnosis not present

## 2017-06-25 DIAGNOSIS — Z90710 Acquired absence of both cervix and uterus: Secondary | ICD-10-CM | POA: Diagnosis not present

## 2017-06-29 ENCOUNTER — Telehealth: Payer: Self-pay

## 2017-06-29 NOTE — Telephone Encounter (Signed)
Told Melinda Robertson that the US showed cyst on the right ovary that looks like normal cyst with ovulation every month.  Dr. Denman Robertson recommends with  her ovaries appearing grossly normal,  continuous OCP's to see if this improves some of her symptoms. Melinda Robertson can discuss cyst suppression and implementation with Melinda Robertson. Pt will call Melinda Robertson to set up appointment.

## 2018-01-05 ENCOUNTER — Encounter: Payer: Self-pay | Admitting: Gastroenterology

## 2018-02-14 ENCOUNTER — Ambulatory Visit: Payer: BLUE CROSS/BLUE SHIELD | Admitting: Gastroenterology

## 2018-07-11 ENCOUNTER — Ambulatory Visit: Payer: Self-pay | Admitting: *Deleted

## 2018-07-11 NOTE — Telephone Encounter (Signed)
Melinda Robertson called for a new patient appointment with Dr. Jonni Sanger. Then stated she had 2 episodes of chest heaviness along with left arm pain, one this morning and again around noon. She had SOB and visible sweating at the time. Rated the pain-heaviness as 3-4. The episodes today lasted approximately 45 minutes. 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 This happened once last week while at work, also.   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 Stated she had cold symptoms over the weekend. B/p was not taken until afterwards and was normal at the time. HR was elevated to 130-170's with all 3 episodes Referred her to the emergency room at this time.   Reason for Disposition . Visible sweat on face or sweat dripping down face  Answer Assessment - Initial Assessment Questions 1. LOCATION: "Where does it hurt?"       Upper left area of chest tightness, heaviness 2. RADIATION: "Does the pain go anywhere else?" (e.g., into neck, jaw, arms, back)     Left arm had the same type of pain as chest 3. ONSET: "When did the chest pain begin?" (Minutes, hours or days)      Happened twice today. 4. PATTERN "Does the pain come and go, or has it been constant since it started?"  "Does it get worse with exertion?"      123/82 p. 74 now 5. DURATION: "How long does it last" (e.g., seconds, minutes, hours)     45 minutes 6. SEVERITY: "How bad is the pain?"  (e.g., Scale 1-10; mild, moderate, or severe)    - MILD (1-3): doesn't interfere with normal activities     - MODERATE (4-7): interferes with normal activities or awakens from sleep    - SEVERE (8-10): excruciating pain, unable to do any normal activities      Mild to moderate 3-4 7. CARDIAC RISK FACTORS: "Do you have any history of heart problems or risk factors for heart disease?" (e.g., prior heart attack, angina; high blood pressure, diabetes, being overweight, high cholesterol, smoking, or strong family history of heart  disease)     maternal grandfather history of heart disease. Denies all others.  8. PULMONARY RISK FACTORS: "Do you have any history of lung disease?"  (e.g., blood clots in lung, asthma, emphysema, birth control pills)     no 9. CAUSE: "What do you think is causing the chest pain?"     No idea 10. OTHER SYMPTOMS: "Do you have any other symptoms?" (e.g., dizziness, nausea, vomiting, sweating, fever, difficulty breathing, cough)       Sweating with these episodes,cough, nasal congestion. When these occur, feels SOB. 11. PREGNANCY: "Is there any chance you are pregnant?" "When was your last menstrual period?"       no  Protocols used: CHEST PAIN-A-AH

## 2018-09-14 ENCOUNTER — Other Ambulatory Visit: Payer: Self-pay | Admitting: Ophthalmology

## 2018-09-14 DIAGNOSIS — G43109 Migraine with aura, not intractable, without status migrainosus: Secondary | ICD-10-CM

## 2018-10-01 ENCOUNTER — Ambulatory Visit: Admission: RE | Admit: 2018-10-01 | Payer: BLUE CROSS/BLUE SHIELD | Source: Ambulatory Visit

## 2019-04-07 ENCOUNTER — Other Ambulatory Visit: Payer: Self-pay

## 2019-04-07 ENCOUNTER — Encounter: Payer: Self-pay | Admitting: Neurology

## 2019-04-07 ENCOUNTER — Telehealth (INDEPENDENT_AMBULATORY_CARE_PROVIDER_SITE_OTHER): Payer: BLUE CROSS/BLUE SHIELD | Admitting: Neurology

## 2019-04-07 VITALS — Ht 60.0 in | Wt 125.0 lb

## 2019-04-07 DIAGNOSIS — G43109 Migraine with aura, not intractable, without status migrainosus: Secondary | ICD-10-CM | POA: Diagnosis not present

## 2019-04-07 DIAGNOSIS — H539 Unspecified visual disturbance: Secondary | ICD-10-CM

## 2019-04-07 DIAGNOSIS — R202 Paresthesia of skin: Secondary | ICD-10-CM

## 2019-04-07 MED ORDER — TOPIRAMATE 50 MG PO TABS: 50.0000 mg | ORAL_TABLET | Freq: Every day | ORAL | 3 refills | Status: DC

## 2019-04-07 MED ORDER — UBROGEPANT 100 MG PO TABS: 1.0000 | ORAL_TABLET | ORAL | 3 refills | Status: DC | PRN

## 2019-04-07 NOTE — Progress Notes (Signed)
Virtual Visit via Video Note The purpose of this virtual visit is to provide medical care while limiting exposure to the novel coronavirus.    Consent was obtained for video visit:  Yes.   Answered questions that patient had about telehealth interaction:  Yes.   I discussed the limitations, risks, security and privacy concerns of performing an evaluation and management service by telemedicine. I also discussed with the patient that there may be a patient responsible charge related to this service. The patient expressed understanding and agreed to proceed.  Pt location: Home Physician Location: office Name of referring provider:  Dorna Bloom, DDS I connected with Melinda Robertson at patients initiation/request on 04/07/2019 at 11:10 AM EDT by video enabled telemedicine application and verified that I am speaking with the correct person using two identifiers. Pt MRN:  [id number] Pt DOB:  20-Apr-1986 Video Participants:  Melinda Robertson   History of Present Illness:  Melinda Robertson is a 33 year old woman who presents for headaches and other neurologic symptoms.  History supplemented by referring provider note.  Since 2007, she has had recurrent headaches associated with other neurologic symptoms, lasting 1 to 3 days.  While pregnant in 2007, she developed a severe stabbing headache over her left eye.  It was associated with paresthesias involving her fingers and toes.  She was admitted to the hospital where she underwent emergency C-section for eclampsia.  With the exception of maybe 3 or 4 mild headaches, she was symptom-free until February 2019.  She developed the same left frontal headache associated with paresthesias but this time associated with nausea and abdominal upset.  There was associated phonophobia and symptoms lasted about a couple of days.  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  She had another similar headache associated with sinus  tachycardia (around 140s bpm) and was found to be dehydrated.  She had a recurrent headache associated with paresthesias of fingers and toes in November, but now accompanied by visual disturbance described as zigzag or spider web pattern in visual field of both eyes lasting 4 to 5 hours as well as difficulty with verbal output lasting a day.  The headache lasted about 3 days.  The paresthesias tend to last as long as the headache.  She saw an eye doctor who suggested ocular migraine and recommended MRI of brain.  However, it was cancelled due to the COVID-19 pandemic.  Her last episode occurred on May 22, where she had associated short-term memory deficits.  It lasted about 2 days.  She reports fatigue and muscle soreness for about a week after each episode.  During the episodes, she takes ibuprofen for the headache which isn't too effective.  There are no specific triggers.  Laying down or applying pressure to the left frontal region helps relieve the headache.    Caffeine:  No Hydration:  Yes Exercise:  Routine Depression/anxiety:  No.   Sleep hygiene:  Good. Other pain:  No She is not on birth control.  Status post hysterectomy for cervical cancer Family history of migraines:  No   Past Medical History:     Past Medical History:  Diagnosis Date   Abnormal Pap smear    Cervical cancer ([place])    Eclampsia    Miscarriage 2008, 2009   Seizures ([place]) 2007   with eclampsia    Medications:     Outpatient Encounter Medications as of 04/07/2019  Medication Sig   ibuprofen (ADVIL,MOTRIN) 200 MG  tablet Take 400-600 mg by mouth 2 (two) times daily as needed for mild pain or moderate pain. Headache or cramping   Multiple Vitamins-Minerals (MULTIVITAMIN WITH MINERALS) tablet Take 1 tablet by mouth daily.   No facility-administered encounter medications on file as of 04/07/2019.     Allergies:     Allergies  Allergen Reactions   Hydrocodone Other (See Comments)    Oxycodone-Acetaminophen Other (See Comments)    Family History:      Family History  Problem Relation Age of Onset   Diabetes Maternal Grandfather    Hypertension Maternal Grandfather     Social History: Social History        Socioeconomic History   Marital status: Married    Spouse name: Not on file   Number of children: Not on file   Years of education: Not on file   Highest education level: Not on file  Occupational History   Not on file  Social Needs   Financial resource strain: Not on file   Food insecurity:    Worry: Not on file    Inability: Not on file   Transportation needs:    Medical: Not on file    Non-medical: Not on file  Tobacco Use   Smoking status: Never Smoker   Smokeless tobacco: Never Used  Substance and Sexual Activity   Alcohol use: Yes    Comment: Occassionally- 1x  /mth   Drug use: No   Sexual activity: Yes    Birth control/protection: Pill  Lifestyle   Physical activity:    Days per week: Not on file    Minutes per session: Not on file   Stress: Not on file  Relationships   Social connections:    Talks on phone: Not on file    Gets together: Not on file    Attends religious service: Not on file    Active member of club or organization: Not on file    Attends meetings of clubs or organizations: Not on file    Relationship status: Not on file   Intimate partner violence:    Fear of current or ex partner: Not on file    Emotionally abused: Not on file    Physically abused: Not on file    Forced sexual activity: Not on file  Other Topics Concern   Not on file  Social History Narrative   Not on file   Observations/Objective:   There were no vitals filed for this visit. No acute distress.  Alert and oriented.  Speech fluent and not dysarthric.  Language intact.  Face symmetric.  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 Given the increased frequency and new associated symptoms over the past year, MRI of brain with and without contrast is warranted.  2. For preventative management, start topiramate 50mg  at bedtime 3.  Given the prolonged aura lasting at least over a day, I would like to avoid triptans for now.  For abortive therapy, will try Ubrelvy 4.  Limit use of pain relievers to no more than 2 days out of week to prevent risk of rebound or medication-overuse headache. 5.  Keep headache diary 6.  Exercise, hydration, caffeine cessation, sleep hygiene, monitor for and avoid triggers 7.  Consider:  magnesium citrate 400mg  daily, riboflavin 400mg  daily, and coenzyme Q10 100mg  three times daily 8. Always keep in mind that currently taking a hormone or birth control may be a possible trigger or aggravating factor  for migraine. 9. Follow up in 4 months.   Follow Up Instructions:              -I discussed the assessment and treatment plan with the patient. The patient was provided an opportunity to ask questions and all were answered. The patient agreed with the plan and demonstrated an understanding of the instructions.  The patient was advised to call back or seek an in-person evaluation if the symptoms worsen or if the condition fails to improve as anticipated.    Total Time spent in visit with the patient was:  40 minutes   Dudley Major, DO

## 2019-04-07 NOTE — Addendum Note (Signed)
Addended by: Clois Comber on: 04/07/2019 12:22 PM   Modules accepted: Orders

## 2019-04-12 NOTE — Progress Notes (Signed)
Melinda Robertson 100mg  prior authorization done and approved.  Pharmacy aware.

## 2019-04-12 NOTE — Progress Notes (Addendum)
Rcvd notification on cover My Med[employer] from [employer] Ubrelvy 100 mg tablets approved Effective from 04/12/2019 through 07/04/2019

## 2019-07-10 ENCOUNTER — Other Ambulatory Visit: Payer: Self-pay | Admitting: Neurology

## 2019-07-25 ENCOUNTER — Other Ambulatory Visit: Payer: Self-pay | Admitting: Neurology

## 2019-08-08 NOTE — Progress Notes (Deleted)
NEUROLOGY FOLLOW UP OFFICE NOTE  Melinda Robertson IN:[id number]  HISTORY OF PRESENT ILLNESS: Melinda Robertson is a 33 year old woman who follows up for suspected migraine with prolonged aura.  UPDATE: She had an MRI of brain with and without contrast performed on 04/12/2019 which was normal.  Intensity:  *** Duration:  *** Frequency:  *** Frequency of abortive medication: *** Current NSAIDS:  *** Current analgesics:  *** Current triptans:  none Current ergotamine:  none Current anti-emetic:  none Current muscle relaxants:  none Current anti-anxiolytic:  none Current sleep aide:  none Current Antihypertensive medications:  none Current Antidepressant medications:  none Current Anticonvulsant medications:  topiramate 50mg  at bedtime Current anti-CGRP:  Ubrelvy Current Vitamins/Herbal/Supplements:  MVI Current Antihistamines/Decongestants:  none Other therapy:  none  Caffeine: No Hydration: Yes Exercise: Routine Depression/anxiety: No.  Sleep hygiene: Good. Other pain: No  HISTORY: Since 2007, she has had recurrent headaches associated with other neurologic symptoms, lasting 1 to 3 days. While pregnant in 2007, she developed a severe stabbing headache over her left eye. It was associated with paresthesias involving her fingers and toes. She was admitted to the hospital where she underwent emergency C-section for eclampsia. With the exception of maybe 3 or 4 mild headaches, she was symptom-free until February 2019. She developed the same left frontal headache associated with paresthesias but this time associated with nausea and abdominal upset. There was associated phonophobia and symptoms lasted about a couple of days. 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 She had another similar headache associated with sinus tachycardia (around 140s bpm) and was found to be dehydrated. She had a recurrent headache associated with paresthesias  of fingers and toes in November, but now accompanied by visual disturbance described as zigzag or spider web pattern in visual field of both eyes lasting 4 to 5 hours as well as difficulty with verbal output lasting a day. The headache lasted about 3 days. The paresthesias tend to last as long as the headache. She saw an eye doctor who suggested ocular migraine and recommended MRI of brain. However, it was cancelled due to the COVID-19 pandemic. Her last episode occurred on May 22, where she had associated short-term memory deficits. It lasted about 2 days. She reports fatigue and muscle soreness for about a week after each episode. During the episodes, she takes ibuprofen for the headache which isn't too effective. There are no specific triggers. Laying down or applying pressure to the left frontal region helps relieve the headache.    She is not on birth control. Status post hysterectomy for cervical cancer Family history of migraines: No  PAST MEDICAL HISTORY: Past Medical History:  Diagnosis Date  . Abnormal Pap smear   . Cervical cancer ([place])   . Eclampsia   . Miscarriage 2008, 2009  . Seizures ([place]) 2007   with eclampsia    MEDICATIONS: Current Outpatient Medications on File Prior to Visit  Medication Sig Dispense Refill  . diazepam (VALIUM) 5 MG tablet Take 5 mg by mouth every 6 (six) hours as needed for anxiety. 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 ibuprofen (ADVIL,MOTRIN) 200 MG tablet Take 400-600 mg by mouth 2 (two) times daily as needed for mild pain or moderate pain. Headache or cramping    . Multiple Vitamins-Minerals (MULTIVITAMIN WITH MINERALS) tablet Take 1 tablet by mouth daily.    Marland Kitchen topiramate (TOPAMAX) 50 MG tablet TAKE 1 TABLET BY MOUTH EVERYDAY AT BEDTIME 90 tablet  1  . Ubrogepant (UBRELVY) 100 MG TABS Take 1 tablet by mouth as needed (May repeat in 2 hours.  Maximum 2 tablets in 24 hours). 16 tablet 3   No current facility-administered medications on file prior to  visit.     ALLERGIES: Allergies  Allergen Reactions  . Hydrocodone Other (See Comments)  . Oxycodone-Acetaminophen Other (See Comments)    FAMILY HISTORY: Family History  Problem Relation Age of Onset  . Diabetes Maternal Grandfather   . Hypertension Maternal Grandfather    SOCIAL HISTORY: Social History   Socioeconomic History  . Marital status: Married    Spouse name: Not on file  . Number of children: 1  . Years of education: Not on file  . Highest education level: Not on file  Occupational History  . Not on file  Social Needs  . Financial resource strain: Not on file  . Food insecurity    Worry: Not on file    Inability: Not on file  . Transportation needs    Medical: Not on file    Non-medical: Not on file  Tobacco Use  . Smoking status: Never Smoker  . Smokeless tobacco: Never Used  Substance and Sexual Activity  . Alcohol use: Yes    Comment: Occassionally- 1x  /mth  . Drug use: No  . Sexual activity: Yes    Birth control/protection: Surgical    Comment: Hyst  Lifestyle  . Physical activity    Days per week: Not on file    Minutes per session: Not on file  . Stress: Not on file  Relationships  . Social Herbalist on phone: Not on file    Gets together: Not on file    Attends religious service: Not on file    Active member of club or organization: Not on file    Attends meetings of clubs or organizations: Not on file    Relationship status: Not on file  . Intimate partner violence    Fear of current or ex partner: Not on file    Emotionally abused: Not on file    Physically abused: Not on file    Forced sexual activity: Not on file  Other Topics Concern  . Not on file  Social History Narrative   Lives with husband and one daughter in two story home      Pt is right handed      Highest level of edu- college      Works full time    REVIEW OF SYSTEMS: Constitutional: No fevers, chills, or sweats, no generalized fatigue, change  in appetite Eyes: No visual changes, double vision, eye pain Ear, nose and throat: No hearing loss, ear pain, nasal congestion, sore throat Cardiovascular: No chest pain, palpitations Respiratory:  No shortness of breath at rest or with exertion, wheezes GastrointestinaI: No nausea, vomiting, diarrhea, abdominal pain, fecal incontinence Genitourinary:  No dysuria, urinary retention or frequency Musculoskeletal:  No neck pain, back pain Integumentary: No rash, pruritus, skin lesions Neurological: as above Psychiatric: No depression, insomnia, anxiety Endocrine: No palpitations, fatigue, diaphoresis, mood swings, change in appetite, change in weight, increased thirst Hematologic/Lymphatic:  No purpura, petechiae. Allergic/Immunologic: no itchy/runny eyes, nasal congestion, recent allergic reactions, rashes  PHYSICAL EXAM: *** General: No acute distress.  Patient appears well-groomed.   Head:  Normocephalic/atraumatic Eyes:  Fundi examined but not visualized Neck: supple, no paraspinal tenderness, full range of motion Heart:  Regular rate and rhythm Lungs:  Clear to auscultation bilaterally  Back: No paraspinal tenderness Neurological Exam: alert and oriented to person, place, and time. Attention span and concentration intact, recent and remote memory intact, fund of knowledge intact.  Speech fluent and not dysarthric, language intact.  CN II-XII intact. Bulk and tone normal, muscle strength 5/5 throughout.  Sensation to light touch, temperature and vibration intact.  Deep tendon reflexes 2+ throughout, toes downgoing.  Finger to nose and heel to shin testing intact.  Gait normal, Romberg negative.  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  PLAN: 1.  For preventative management, *** 2.  For abortive therapy, *** 3.  Limit use of pain relievers to no more than 2 days out of week to prevent risk of rebound or medication-overuse headache. 4.  Keep  headache diary 5.  Exercise, hydration, caffeine cessation, sleep hygiene, monitor for and avoid triggers 6.  Consider:  magnesium citrate 400mg  daily, riboflavin 400mg  daily, and coenzyme Q10 100mg  three times daily 7. Always keep in mind that currently taking a hormone or birth control may be a possible trigger or aggravating factor for migraine. 8. Follow up ***  Metta Clines, DO  CC: Arvella Nigh, MD

## 2019-08-11 ENCOUNTER — Ambulatory Visit: Payer: BLUE CROSS/BLUE SHIELD | Admitting: Neurology

## 2020-08-27 ENCOUNTER — Encounter (HOSPITAL_COMMUNITY): Payer: Self-pay

## 2020-08-27 ENCOUNTER — Emergency Department (HOSPITAL_COMMUNITY): Payer: Self-pay

## 2020-08-27 ENCOUNTER — Emergency Department (HOSPITAL_COMMUNITY)
Admission: EM | Admit: 2020-08-27 | Discharge: 2020-08-27 | Disposition: A | Discharge status: Home / Self Care | Payer: Self-pay | Attending: Emergency Medicine | Admitting: Emergency Medicine

## 2020-08-27 ENCOUNTER — Other Ambulatory Visit: Payer: Self-pay

## 2020-08-27 DIAGNOSIS — M940 Chondrocostal junction syndrome [Tietze]: Secondary | ICD-10-CM | POA: Insufficient documentation

## 2020-08-27 DIAGNOSIS — Z8541 Personal history of malignant neoplasm of cervix uteri: Secondary | ICD-10-CM | POA: Insufficient documentation

## 2020-08-27 DIAGNOSIS — R000 Tachycardia, unspecified: Secondary | ICD-10-CM | POA: Insufficient documentation

## 2020-08-27 DIAGNOSIS — R0789 Other chest pain: Secondary | ICD-10-CM | POA: Insufficient documentation

## 2020-08-27 DIAGNOSIS — R079 Chest pain, unspecified: Secondary | ICD-10-CM

## 2020-08-27 LAB — BASIC METABOLIC PANEL
Anion gap: 8 (ref 5–15)
BUN: 5 mg/dL — ABNORMAL LOW (ref 6–20)
CO2: 27 mmol/L (ref 22–32)
Calcium: 9 mg/dL (ref 8.9–10.3)
Chloride: 105 mmol/L (ref 98–111)
Creatinine, Ser: 0.86 mg/dL (ref 0.44–1.00)
GFR, Estimated: >60 mL/min (ref >60)
Glucose, Bld: 105 mg/dL — ABNORMAL HIGH (ref 70–99)
Potassium: 3.5 mmol/L (ref 3.5–5.1)
Sodium: 140 mmol/L (ref 135–145)

## 2020-08-27 LAB — CBC
HCT: 41.1 % (ref 36.0–46.0)
Hemoglobin: 13.9 g/dL (ref 12.0–15.0)
MCH: 32.6 pg (ref 26.0–34.0)
MCHC: 33.8 g/dL (ref 30.0–36.0)
MCV: 96.3 fL (ref 80.0–100.0)
Platelets: 261 10*3/uL (ref 150–400)
RBC: 4.27 MIL/uL (ref 3.87–5.11)
RDW: 11.9 % (ref 11.5–15.5)
WBC: 6.2 10*3/uL (ref 4.0–10.5)
nRBC: 0 % (ref 0.0–0.2)

## 2020-08-27 LAB — I-STAT BETA HCG BLOOD, ED (MC, WL, AP ONLY): I-stat hCG, quantitative: <5 m[IU]/mL (ref <5)

## 2020-08-27 LAB — D-DIMER, QUANTITATIVE (NOT AT ARMC): D-Dimer, Quant: <0.27 ug/mL-FEU (ref 0.00–0.50)

## 2020-08-27 LAB — TROPONIN I (HIGH SENSITIVITY): Troponin I (High Sensitivity): <2 ng/L (ref <18)

## 2020-08-27 MED ORDER — NAPROXEN 375 MG PO TABS: 375.0000 mg | ORAL_TABLET | Freq: Two times a day (BID) | ORAL | 0 refills | Status: AC

## 2020-08-27 NOTE — ED Notes (Signed)
Patient verbalizes understanding of discharge instructions. Opportunity for questioning and answers were provided. Pt discharged from ED ambulatory.  

## 2020-08-27 NOTE — ED Triage Notes (Signed)
Pt reports an episode of sharp, stabbing chest pain last week, states she was in a stressful situation when it occurred so she brushed it off but the next day she still felt sore. Pt went to [employer] on Saturday and had increased pain that night. Today pain is worse when she takes in a deep breath. Pt a.o, resp e.u

## 2020-08-27 NOTE — ED Provider Notes (Signed)
[facility] EMERGENCY DEPARTMENT Provider Note   CSN: [id number] Arrival date & time: 08/27/20  1721     History Chief Complaint  Patient presents with  . Chest Pain    Melinda Robertson is a 34 y.o. female.  HPI  HPI: A 34 year old patient presents for evaluation of chest pain. Initial onset of pain was more than 6 hours ago. The patient's chest pain is sharp and is not worse with exertion. The patient's chest pain is middle- or left-sided, is not well-localized, is not described as heaviness/pressure/tightness and does radiate to the arms/jaw/neck. The patient does not complain of nausea and denies diaphoresis. The patient has no history of stroke, has no history of peripheral artery disease, has not smoked in the past 90 days, denies any history of treated diabetes, has no relevant family history of coronary artery disease (first degree relative at less than age 37), is not hypertensive, has no history of hypercholesterolemia and does not have an elevated BMI (>=30).  Patient states she first noticed the symptoms after she had an episode of being upset and stressed.  Symptoms persisted however the next morning.  She did not think too much of it ended up going to an amusement park with her husband and daughter.  She noticed while she was on the roller coaster that she was having more pain.  Patient has noticed that the pain increases with deep breathing as well as certain movements. Past Medical History:  Diagnosis Date  . Abnormal Pap smear   . Cervical cancer ([place])   . Eclampsia   . Miscarriage 2008, 2009  . Seizures ([place]) 2007   with eclampsia    Patient Active Problem List   Diagnosis Date Noted  . Pain in joint of right shoulder 11/13/2016  . Shoulder pain 11/13/2016  . Strain of right trapezius muscle 11/13/2016  . Status post laparoscopic assisted vaginal hysterectomy (LAVH) 01/23/2013    Class: Status post  . 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 BREAST ENHANCEMENT SURGERY  2008  . butt implants  2007  . CERVICAL BIOPSY  W/ LOOP ELECTRODE EXCISION    . CERVICAL CONIZATION W/BX  07/28/2012   Procedure: CONIZATION CERVIX WITH BIOPSY;  Surgeon: Darlyn Chamber, MD;  Location: [facility];  Service: Gynecology;  Laterality: N/A;  cold knife cone  . CESAREAN SECTION  2007, 2011   x  2  . DILATION AND CURETTAGE OF UTERUS    . LAPAROSCOPIC ASSISTED VAGINAL HYSTERECTOMY N/A 01/23/2013   Procedure: LAPAROSCOPIC ASSISTED VAGINAL HYSTERECTOMY;  Surgeon: Darlyn Chamber, MD;  Location: [facility];  Service: Gynecology;  Laterality: N/A;  . TONSILLECTOMY  1999  . WISDOM TOOTH EXTRACTION       OB History    Gravida  4   Para  2   Term      Preterm  2   AB  2   Living  1     SAB  2   TAB      Ectopic      Multiple      Live Births  2           Family History  Problem Relation Age of Onset  . Diabetes Maternal Grandfather   . Hypertension Maternal Grandfather     Social History  Tobacco Use  . Smoking status: Never Smoker  . Smokeless tobacco: Never Used  Vaping Use  . Vaping Use: Never used  Substance Use Topics  . Alcohol use: Yes    Comment: Occassionally- 1x  /mth  . Drug use: No    Home Medications Prior to Admission medications   Medication Sig Start Date End Date Taking? Authorizing Provider  amphetamine-dextroamphetamine (ADDERALL) 20 MG tablet Take 20 mg by mouth 4 (four) times daily as needed (adhd).  08/16/20  Yes [provider]  diazepam (VALIUM) 5 MG tablet Take 5 mg by mouth as needed for anxiety (TMJ).    Yes [provider]  ibuprofen (ADVIL,MOTRIN) 200 MG tablet Take 400-600 mg by mouth as needed for mild pain or moderate pain. Headache or cramping   Yes [provider]  Multiple Vitamins-Minerals (MULTIVITAMIN WITH MINERALS)  tablet Take 1 tablet by mouth daily.   Yes [provider]  naproxen (NAPROSYN) 375 MG tablet Take 1 tablet (375 mg total) by mouth 2 (two) times daily. 08/27/20   Dorie Rank, MD  topiramate (TOPAMAX) 50 MG tablet TAKE 1 TABLET BY MOUTH EVERYDAY AT BEDTIME Patient not taking: Reported on 08/27/2020 07/25/19   Pieter Partridge, DO  Ubrogepant (UBRELVY) 100 MG TABS Take 1 tablet by mouth as needed (May repeat in 2 hours.  Maximum 2 tablets in 24 hours). Patient not taking: Reported on 08/27/2020 04/07/19   Pieter Partridge, DO    Allergies    Hydrocodone and Oxycodone-acetaminophen  Review of Systems   Review of Systems  All other systems reviewed and are negative.   Physical Exam Updated Vital Signs BP 116/74 (BP Location: Right Arm)   Pulse 96   Temp 99.3 F (37.4 C) (Oral)   Resp 18   LMP 12/27/2012   SpO2 100%   Physical Exam Vitals and nursing note reviewed.  Constitutional:      General: She is not in acute distress.    Appearance: She is well-developed.  HENT:     Head: Normocephalic and atraumatic.     Right Ear: External ear normal.     Left Ear: External ear normal.  Eyes:     General: No scleral icterus.       Right eye: No discharge.        Left eye: No discharge.     Conjunctiva/sclera: Conjunctivae normal.  Neck:     Trachea: No tracheal deviation.  Cardiovascular:     Rate and Rhythm: Regular rhythm. Tachycardia present.  Pulmonary:     Effort: Pulmonary effort is normal. No respiratory distress.     Breath sounds: Normal breath sounds. No stridor. No wheezing or rales.  Chest:     Comments: Tenderness to palpation anterior chest wall Abdominal:     General: Bowel sounds are normal. There is no distension.     Palpations: Abdomen is soft.     Tenderness: There is no abdominal tenderness. There is no guarding or rebound.  Musculoskeletal:        General: No tenderness.     Cervical back: Neck supple.  Skin:    General: Skin is warm and dry.      Findings: No rash.  Neurological:     Mental Status: She is alert.     Cranial Nerves: No cranial nerve deficit (no facial droop, extraocular movements intact, no slurred speech).     Sensory: No sensory deficit.     Motor: No abnormal muscle tone or  seizure activity.     Coordination: Coordination normal.     ED Results / Procedures / Treatments   Labs (all labs ordered are listed, but only abnormal results are displayed) Labs Reviewed  BASIC METABOLIC PANEL - Abnormal; Notable for the following components:      Result Value   Glucose, Bld 105 (*)    BUN 5 (*)    All other components within normal limits  CBC  D-DIMER, QUANTITATIVE (NOT AT [facility])  I-STAT BETA HCG BLOOD, ED ([facility], [facility], [facility] ONLY)  TROPONIN I (HIGH SENSITIVITY)    EKG EKG Interpretation  Date/Time:  Tuesday August 27 2020 17:26:31 EDT Ventricular Rate:  115 PR Interval:  136 QRS Duration: 62 QT Interval:  290 QTC Calculation: 401 R Axis:   81 Text Interpretation: Sinus tachycardia Right atrial enlargement Borderline ECG No significant change since last tracing Confirmed by Dorie Rank [phone]) on 08/27/2020 6:00:41 PM   Radiology DG Chest 2 View  Result Date: 08/27/2020 CLINICAL DATA:  34 year old female with chest pain. EXAM: CHEST - 2 VIEW COMPARISON:  Chest radiograph dated 10/05/2012. FINDINGS: The heart size and mediastinal contours are within normal limits. Both lungs are clear. The visualized skeletal structures are unremarkable. IMPRESSION: No active cardiopulmonary disease. Electronically Signed   By: Anner Crete M.D.   On: 08/27/2020 18:07    Procedures Procedures (including critical care time)  Medications Ordered in ED Medications - No data to display  ED Course  I have reviewed the triage vital signs and the nursing notes.  Pertinent labs & imaging results that were available during my care of the patient were reviewed by me and considered in my medical decision making (see chart for  details).  Clinical Course as of Aug 27 2045  Tue Aug 27, 2020  1833 Ox sat 99% at bedside   [JK]  1848 Symptoms not typical for ACS.  Initial troponin normal   [JK]  1848 Patient was mildly tachycardic.  We will add on a D-dimer.   [TA]  5697 Initial hypothermia was likely an error.  Patient's oxygen saturation was not likely at 90% either.   [XY]  8016 Repeat temp is normal.   [JK]  2013 D-dimer is negative   [JK]    Clinical Course User Index [JK] Dorie Rank, MD   MDM Rules/Calculators/A&P HEAR Score: 0                        Patient presented to ED for evaluation of chest pain.  Low risk heart score.  Troponin is negative.  I doubt symptoms are related to acute coronary syndrome.  Patient did mention a pleuritic component.  No significant PE risk factors and her D-dimer is negative.  I doubt pulmonary embolism.  Patient does have chest wall tenderness.  Symptoms may be costochondritis.  Will discharge home with course of NSAID.  Discussed outpatient follow-up with PCP.  At this time there does not appear to be any evidence of an acute emergency medical condition and the patient appears stable for discharge with appropriate outpatient follow up.  Final Clinical Impression(s) / ED Diagnoses Final diagnoses:  Chest pain, unspecified type  Costochondritis    Rx / DC Orders ED Discharge Orders         Ordered    naproxen (NAPROSYN) 375 MG tablet  2 times daily        08/27/20 2043  Dorie Rank, MD 08/27/20 2046

## 2020-08-27 NOTE — Discharge Instructions (Signed)
Take the medications as prescribed.  Follow-up with your doctor next week to be rechecked if the symptoms persist.

## 2020-11-10 ENCOUNTER — Emergency Department (HOSPITAL_COMMUNITY): Admission: EM | Admit: 2020-11-10 | Discharge: 2020-11-10 | Discharge status: Against Medical Advice | Payer: Self-pay

## 2020-11-10 ENCOUNTER — Other Ambulatory Visit: Payer: Self-pay

## 2020-11-10 NOTE — ED Notes (Signed)
Pt called 3x for triage. Eloped prior to triage.

## 2020-11-10 NOTE — ED Notes (Signed)
Pt came to registration window stating she got a text that we had called her. Pt had left the property and came back after getting text

## 2021-05-27 IMAGING — DX DG CHEST 2V
2 series · 2 of 2 positions shown · non-contrast
Comparison: Chest radiograph dated 10/05/2012.

CLINICAL DATA: 34-year-old female with chest pain.

EXAM:
CHEST - 2 VIEW

[chest pa]
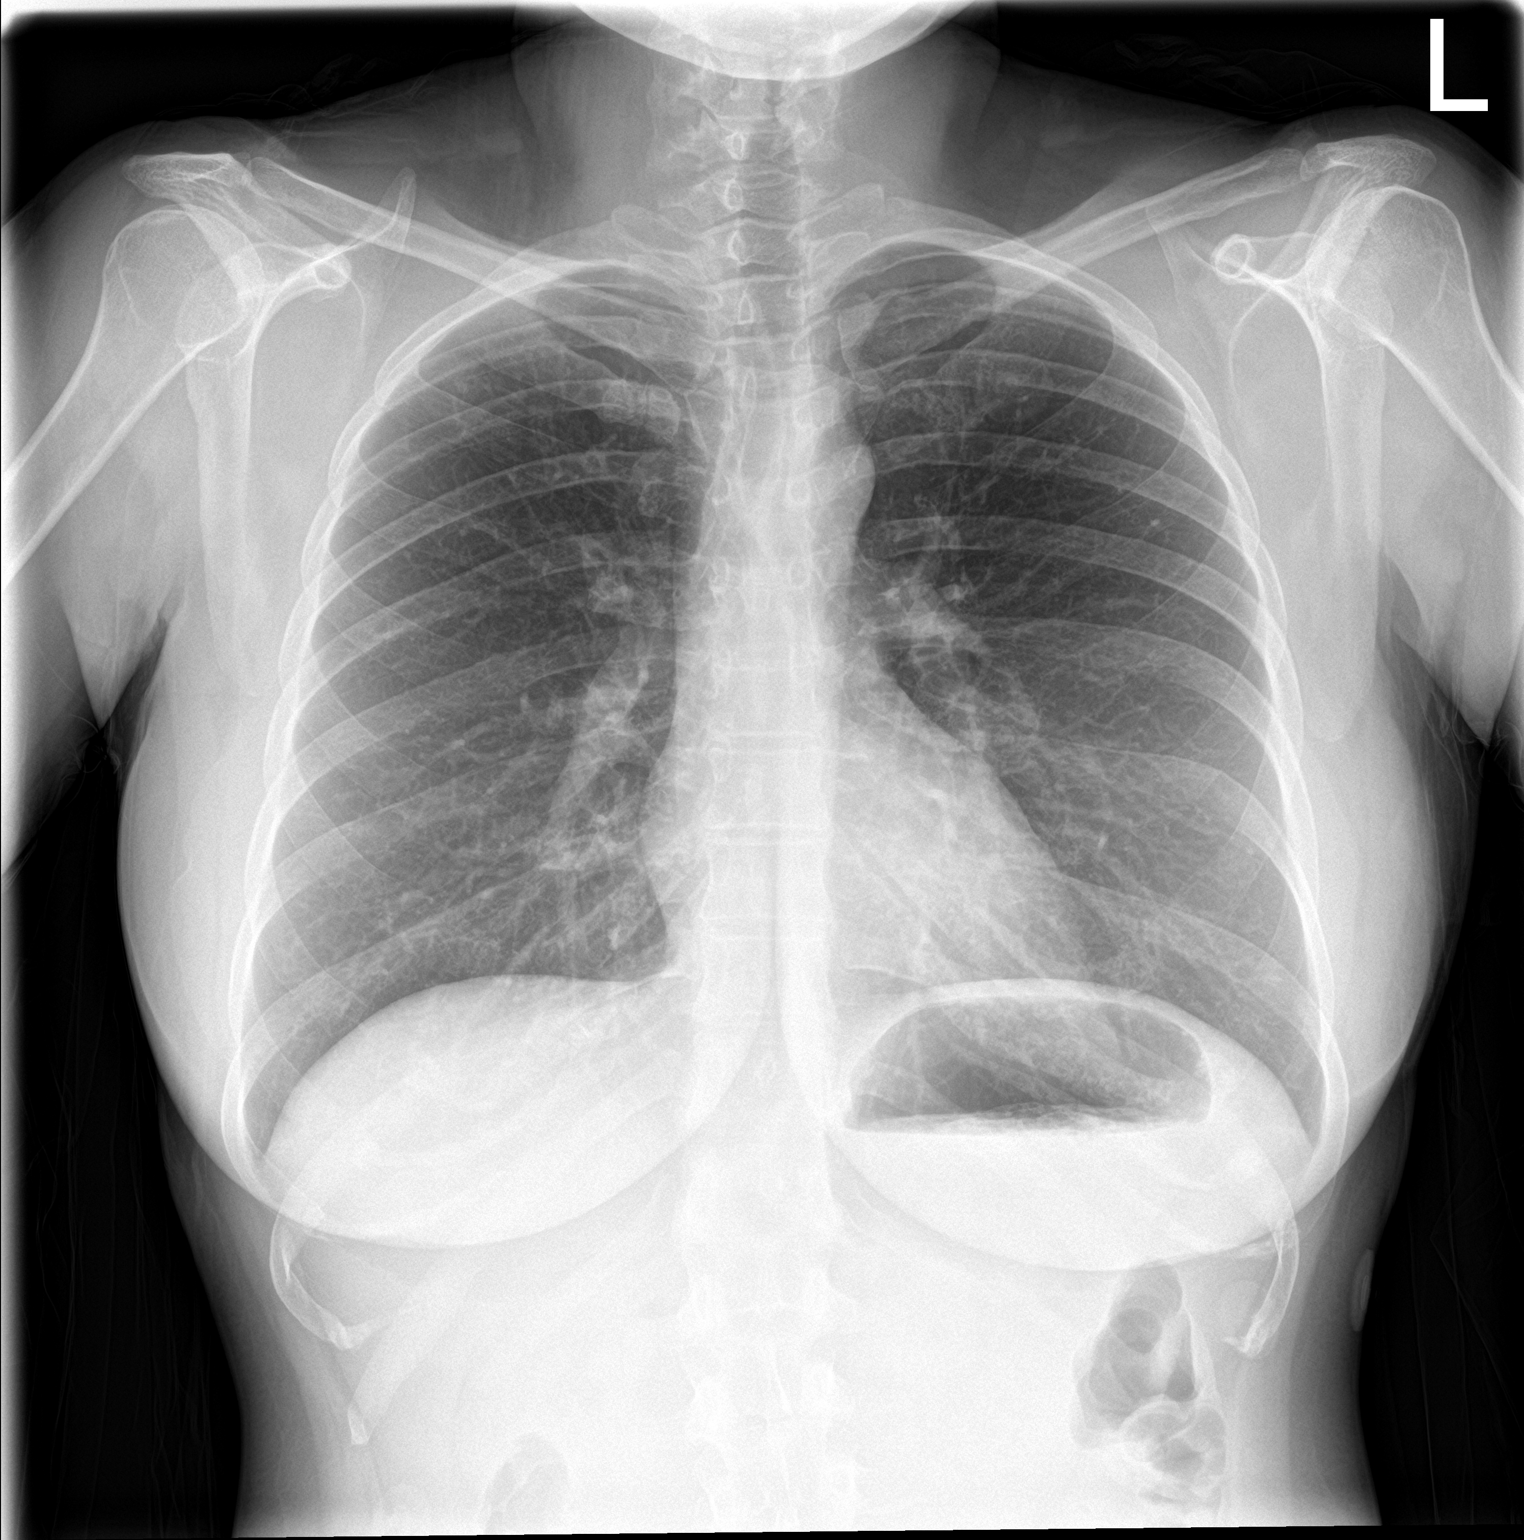

[chest lat]
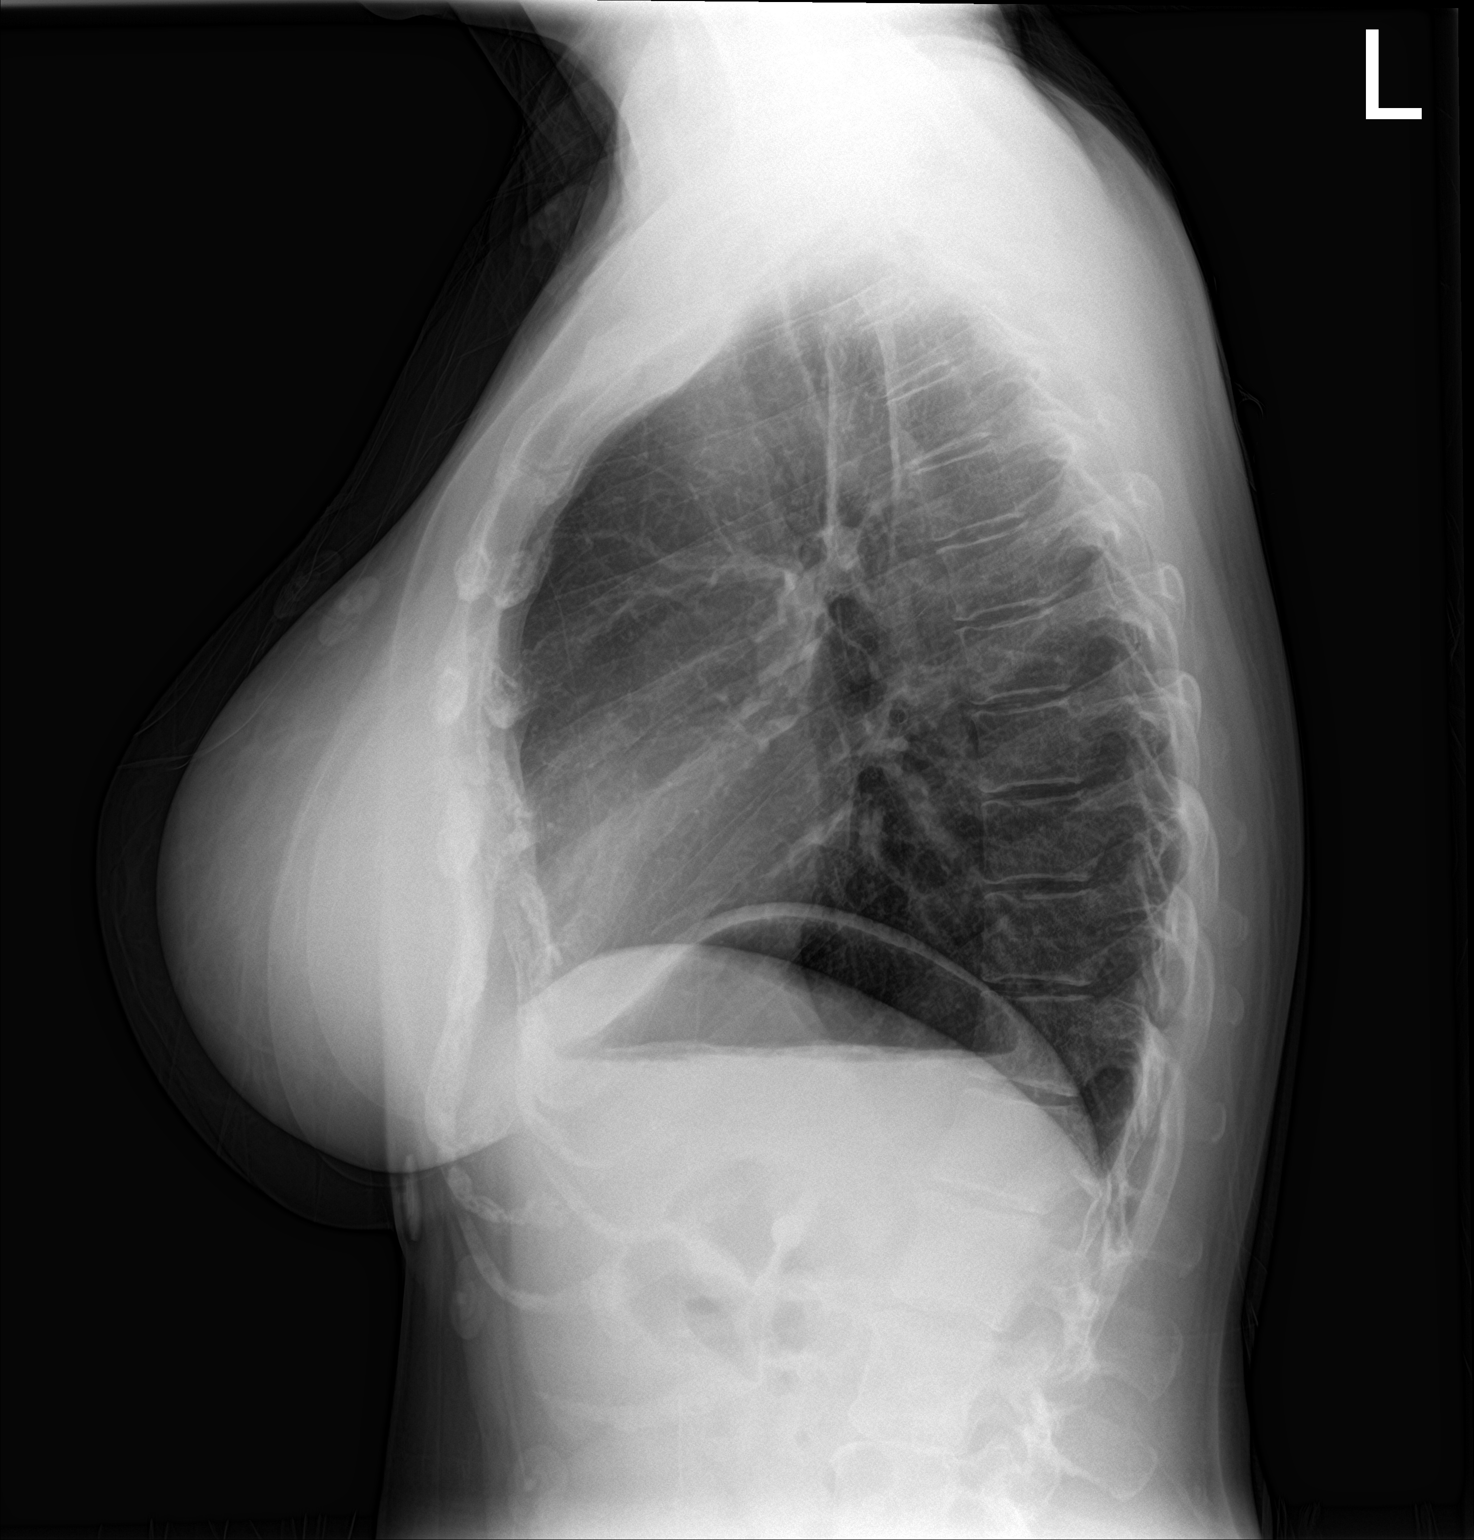

[2 of 2 positions shown; findings below may reference images not displayed]

FINDINGS: The heart size and mediastinal contours are within normal limits.
Both lungs are clear. The visualized skeletal structures are
unremarkable.
IMPRESSION: No active cardiopulmonary disease.

## 2023-05-03 ENCOUNTER — Emergency Department (HOSPITAL_COMMUNITY): Payer: BC Managed Care – PPO

## 2023-05-03 ENCOUNTER — Other Ambulatory Visit: Payer: Self-pay

## 2023-05-03 ENCOUNTER — Emergency Department (HOSPITAL_COMMUNITY)
Admission: EM | Admit: 2023-05-03 | Discharge: 2023-05-04 | Disposition: A | Discharge status: Home / Self Care | Payer: BC Managed Care – PPO | Attending: Emergency Medicine | Admitting: Emergency Medicine

## 2023-05-03 DIAGNOSIS — Z8541 Personal history of malignant neoplasm of cervix uteri: Secondary | ICD-10-CM | POA: Insufficient documentation

## 2023-05-03 DIAGNOSIS — R0602 Shortness of breath: Secondary | ICD-10-CM | POA: Insufficient documentation

## 2023-05-03 DIAGNOSIS — R079 Chest pain, unspecified: Secondary | ICD-10-CM | POA: Diagnosis present

## 2023-05-03 LAB — BASIC METABOLIC PANEL
Anion gap: 8 (ref 5–15)
BUN: 7 mg/dL (ref 6–20)
CO2: 23 mmol/L (ref 22–32)
Calcium: 8.8 mg/dL — ABNORMAL LOW (ref 8.9–10.3)
Chloride: 104 mmol/L (ref 98–111)
Creatinine, Ser: 0.92 mg/dL (ref 0.44–1.00)
GFR, Estimated: >60 mL/min (ref >60)
Glucose, Bld: 108 mg/dL — ABNORMAL HIGH (ref 70–99)
Potassium: 3.5 mmol/L (ref 3.5–5.1)
Sodium: 135 mmol/L (ref 135–145)

## 2023-05-03 LAB — CBC
HCT: 40 % (ref 36.0–46.0)
Hemoglobin: 13.7 g/dL (ref 12.0–15.0)
MCH: 32.2 pg (ref 26.0–34.0)
MCHC: 34.3 g/dL (ref 30.0–36.0)
MCV: 93.9 fL (ref 80.0–100.0)
Platelets: 290 10*3/uL (ref 150–400)
RBC: 4.26 MIL/uL (ref 3.87–5.11)
RDW: 11.9 % (ref 11.5–15.5)
WBC: 8.6 10*3/uL (ref 4.0–10.5)
nRBC: 0 % (ref 0.0–0.2)

## 2023-05-03 LAB — TROPONIN I (HIGH SENSITIVITY)
Troponin I (High Sensitivity): <2 ng/L (ref <18)
Troponin I (High Sensitivity): <2 ng/L (ref <18)

## 2023-05-03 LAB — HCG, SERUM, QUALITATIVE: Preg, Serum: NEGATIVE

## 2023-05-03 NOTE — ED Triage Notes (Signed)
Pt arrives to ED c/o non-tramatic/injury left shoulder pain x 3 days that has been accompanied with substernal CP, dizziness and SOB. Pt reports that CP has been intermittent. Pt w/ no cardiac hx.

## 2023-05-03 NOTE — ED Notes (Signed)
Assumed care of patient ambulatory to room with steady gait purse hung over left shoulder. Patient appears in no acute distress as she walks by. Patient changed into gown placed on monitors . Patient reports 3 day hx of left shoulder pain with dizziness sob and intermittent substernal chest pain. Patient a/o x 4 respirations even and non labored denies current cp vs wnl

## 2023-05-04 NOTE — Discharge Instructions (Signed)
You were evaluated today for chest pain.  Your workup was overall reassuring.  I have placed a referral to cardiology for a follow-up appointment.  Please contact them if you do not hear from them within the next few days.  If you have worsening chest pain or develop other life-threatening symptoms such as shortness of breath please return to the emergency department.

## 2023-05-04 NOTE — ED Provider Notes (Signed)
[facility] EMERGENCY DEPARTMENT AT [facility] Provider Note   CSN: [id number] Arrival date & time: 05/03/23  4098     History  Chief Complaint  Patient presents with   Chest Pain    Melinda Robertson is a 37 y.o. female.  Patient presents to the emergency department complaining of left shoulder pain which is been ongoing for 3 days accompanied with intermittent substernal chest pain, dizziness, shortness of breath.  The patient states that she initially woke up at 3 in the morning Friday with shoulder pain.  This seemed to resolve throughout the day.  On Saturday she states her symptoms had gotten much better.  On Sunday morning she experienced chest pain that was substernal, described as a pressure which also self resolved.  Today she states she experienced an episode of vertigo at approximately 4 in the morning which resolved before going to work at 10 AM.  Currently the patient states she is having almost no chest pain at all, denies shortness of breath.  She does endorse some intermittent left-sided shoulder pain.  The patient states she is seeing cardiology in the past for chest pain thought to be related to anxiety.  Past medical history otherwise significant for seizures, cervical cancer, right shoulder pain  HPI     Home Medications Prior to Admission medications   Medication Sig Start Date End Date Taking? Authorizing Provider  amphetamine-dextroamphetamine (ADDERALL) 20 MG tablet Take 20 mg by mouth 4 (four) times daily as needed (adhd).  08/16/20   [provider]  diazepam (VALIUM) 5 MG tablet Take 5 mg by mouth as needed for anxiety (TMJ).     [provider]  ibuprofen (ADVIL,MOTRIN) 200 MG tablet Take 400-600 mg by mouth as needed for mild pain or moderate pain. Headache or cramping    [provider]  Multiple Vitamins-Minerals (MULTIVITAMIN WITH MINERALS) tablet Take 1 tablet by mouth daily.    [provider]  naproxen  (NAPROSYN) 375 MG tablet Take 1 tablet (375 mg total) by mouth 2 (two) times daily. 08/27/20   Linwood Dibbles, MD  topiramate (TOPAMAX) 50 MG tablet TAKE 1 TABLET BY MOUTH EVERYDAY AT BEDTIME Patient not taking: Reported on 08/27/2020 07/25/19   Drema Dallas, DO  Ubrogepant (UBRELVY) 100 MG TABS Take 1 tablet by mouth as needed (May repeat in 2 hours.  Maximum 2 tablets in 24 hours). Patient not taking: Reported on 08/27/2020 04/07/19   Drema Dallas, DO      Allergies    Hydrocodone and Oxycodone-acetaminophen    Review of Systems   Review of Systems  Physical Exam Updated Vital Signs BP 92/62   Pulse 69   Temp 98.3 F (36.8 C) (Oral)   Resp 19   Ht 5' (1.524 m)   LMP 12/27/2012   SpO2 97%   BMI 24.41 kg/m  Physical Exam Vitals and nursing note reviewed.  Constitutional:      General: She is not in acute distress.    Appearance: She is well-developed.  HENT:     Head: Normocephalic and atraumatic.  Eyes:     Conjunctiva/sclera: Conjunctivae normal.  Cardiovascular:     Rate and Rhythm: Normal rate and regular rhythm.     Heart sounds: No murmur heard. Pulmonary:     Effort: Pulmonary effort is normal. No respiratory distress.     Breath sounds: Normal breath sounds.  Chest:     Chest wall: No tenderness.  Abdominal:  Palpations: Abdomen is soft.     Tenderness: There is no abdominal tenderness.  Musculoskeletal:        General: No swelling. Normal range of motion.     Cervical back: Neck supple.     Comments: No increase in pain with active or passive range of motion of the left shoulder.  No tenderness to palpation of the left shoulder or chest  Skin:    General: Skin is warm and dry.     Capillary Refill: Capillary refill takes less than 2 seconds.  Neurological:     Mental Status: She is alert.  Psychiatric:        Mood and Affect: Mood normal.     ED Results / Procedures / Treatments   Labs (all labs ordered are listed, but only abnormal results are  displayed) Labs Reviewed  BASIC METABOLIC PANEL - Abnormal; Notable for the following components:      Result Value   Glucose, Bld 108 (*)    Calcium 8.8 (*)    All other components within normal limits  CBC  HCG, SERUM, QUALITATIVE  TROPONIN I (HIGH SENSITIVITY)  TROPONIN I (HIGH SENSITIVITY)    EKG None  Radiology DG Chest 2 View  Result Date: 05/03/2023 CLINICAL DATA:  Chest pain EXAM: CHEST - 2 VIEW COMPARISON:  Chest radiograph dated August 27, 2020 FINDINGS: The heart size and mediastinal contours are within normal limits. Both lungs are clear. The visualized skeletal structures are unremarkable. IMPRESSION: No active cardiopulmonary disease. Electronically Signed   By: Larose Hires D.O.   On: 05/03/2023 20:31    Procedures Procedures    Medications Ordered in ED Medications - No data to display  ED Course/ Medical Decision Making/ A&P                             Medical Decision Making  This patient presents to the ED for concern of chest pain, this involves an extensive number of treatment options, and is a complaint that carries with it a high risk of complications and morbidity.  The differential diagnosis includes ACS, PE, musculoskeletal pain, anxiety, pneumonia, dissection, others   Co morbidities that complicate the patient evaluation  Cervical cancer, seizures, right-sided shoulder pain   Lab Tests:  I Ordered, and personally interpreted labs.  The pertinent results include: Troponin less than 2, negative pregnancy test, unremarkable BMP, CBC   Imaging Studies ordered:  I ordered imaging studies including chest x-ray I independently visualized and interpreted imaging which showed no acute findings I agree with the radiologist interpretation   Cardiac Monitoring: / EKG:  The patient was maintained on a cardiac monitor.  I personally viewed and interpreted the cardiac monitored which showed an underlying rhythm of: Sinus rhythm with fusion  complexes  Test / Admission - Considered:  Patient is asymptomatic at this time other than some mild left shoulder pain.  Negative troponins x 2, no significant ST elevation or depression on EKG, low heart score.  Very low suspicion of ACS.  No shortness of breath or tachycardia to suggest PE at this time.  No pneumonia on chest x-ray.  Plan to have patient follow-up with cardiology.  She has seen them in the past but states she needs a new referral.  This is provided.  Patient provided with precautions including worsening chest pain, shortness of breath.  I see no indication at this time for admission.  Discharge home  Final Clinical Impression(s) / ED Diagnoses Final diagnoses:  Chest pain, unspecified type    Rx / DC Orders ED Discharge Orders          Ordered    Ambulatory referral to Cardiology       Comments: If you have not heard from the Cardiology office within the next 72 hours please call [phone].   05/04/23 0031              Darrick Grinder, PA-C 05/04/23 0032    Nira Conn, MD 05/04/23 0800

## 2023-12-09 ENCOUNTER — Ambulatory Visit (HOSPITAL_BASED_OUTPATIENT_CLINIC_OR_DEPARTMENT_OTHER)
Admit: 2023-12-09 | Discharge: 2023-12-09 | Disposition: A | Discharge status: Home / Self Care | Payer: BC Managed Care – PPO | Attending: Internal Medicine | Admitting: Internal Medicine

## 2023-12-09 ENCOUNTER — Ambulatory Visit (HOSPITAL_BASED_OUTPATIENT_CLINIC_OR_DEPARTMENT_OTHER)
Admission: RE | Admit: 2023-12-09 | Discharge: 2023-12-09 | Disposition: A | Discharge status: Home / Self Care | Payer: BC Managed Care – PPO | Source: Ambulatory Visit | Attending: Internal Medicine | Admitting: Internal Medicine

## 2023-12-09 ENCOUNTER — Encounter (HOSPITAL_BASED_OUTPATIENT_CLINIC_OR_DEPARTMENT_OTHER): Payer: Self-pay

## 2023-12-09 VITALS — BP 118/80 | HR 112 | Temp 99.2°F | Resp 21 | Wt 175.0 lb

## 2023-12-09 DIAGNOSIS — R051 Acute cough: Secondary | ICD-10-CM | POA: Diagnosis not present

## 2023-12-09 DIAGNOSIS — R059 Cough, unspecified: Secondary | ICD-10-CM

## 2023-12-09 DIAGNOSIS — J09X2 Influenza due to identified novel influenza A virus with other respiratory manifestations: Secondary | ICD-10-CM

## 2023-12-09 LAB — POC COVID19/FLU A&B COMBO
Covid Antigen, POC: NEGATIVE
Influenza A Antigen, POC: POSITIVE — AB
Influenza B Antigen, POC: NEGATIVE

## 2023-12-09 MED ORDER — OSELTAMIVIR PHOSPHATE 75 MG PO CAPS: 75.0000 mg | ORAL_CAPSULE | Freq: Two times a day (BID) | ORAL | 0 refills | Status: AC

## 2023-12-09 MED ORDER — ALBUTEROL SULFATE HFA 108 (90 BASE) MCG/ACT IN AERS: 2.0000 | INHALATION_SPRAY | RESPIRATORY_TRACT | 0 refills | Status: AC | PRN

## 2023-12-09 MED ORDER — ALBUTEROL SULFATE (2.5 MG/3ML) 0.083% IN NEBU
2.5000 mg | INHALATION_SOLUTION | Freq: Once | RESPIRATORY_TRACT | Status: AC
  Administered 2023-12-09: 2.5 mg via RESPIRATORY_TRACT

## 2023-12-09 MED ORDER — BENZONATATE 200 MG PO CAPS: 200.0000 mg | ORAL_CAPSULE | Freq: Three times a day (TID) | ORAL | 0 refills | Status: AC | PRN

## 2023-12-09 NOTE — Discharge Instructions (Addendum)
 Your chest xray does not show pneumonia

## 2023-12-09 NOTE — ED Triage Notes (Signed)
 Onset 2 days ago of cough. Notices it's worse at night when laying in certain positions. No known fever.

## 2023-12-09 NOTE — ED Provider Notes (Signed)
 [facility]    CSN: [id number] Arrival date & time: 12/09/23  1647      History   Chief Complaint Chief Complaint  Patient presents with   Cough    chest pain, cough, breathing issue with weezing and fever - Entered by patient    HPI Melinda Robertson is a 38 y.o. female who presents with onset of cough 2 days ago and mild rhinitis. Has been feeling hot and does not have a thermometer to check. At night she brakes ins sweats and then starts chilling. Has noticed if she lays on her R side wheezing at the end in expiration, coughs too much when on her back, and cough is better, but still coughs when she lays on her L side. Denies hx of smoking or asthma.     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    Past Surgical History:  Procedure Laterality Date   BREAST ENHANCEMENT SURGERY  2008   butt implants  2007   CERVICAL BIOPSY  W/ LOOP ELECTRODE EXCISION     CERVICAL CONIZATION W/BX  07/28/2012   Procedure: CONIZATION CERVIX WITH BIOPSY;  Surgeon: Norleen GORMAN Skill, MD;  Location: [facility];  Service: Gynecology;  Laterality: N/A;  cold knife cone   CESAREAN SECTION  2007, 2011   x  2   DILATION AND CURETTAGE OF UTERUS     LAPAROSCOPIC ASSISTED VAGINAL HYSTERECTOMY N/A 01/23/2013   Procedure: LAPAROSCOPIC ASSISTED VAGINAL HYSTERECTOMY;  Surgeon: Norleen GORMAN Skill, MD;  Location: [facility];  Service: Gynecology;  Laterality: N/A;   TONSILLECTOMY  1999   WISDOM TOOTH  EXTRACTION      OB History     Gravida  4   Para  2   Term      Preterm  2   AB  2   Living  1      SAB  2   IAB      Ectopic      Multiple      Live Births  2            Home Medications    Prior to Admission medications   Medication Sig Start Date End Date Taking? Authorizing Provider  albuterol  (VENTOLIN  HFA) 108 (90 Base) MCG/ACT inhaler Inhale 2 puffs into the lungs every 4 (four) hours as needed for wheezing or shortness of breath. 12/09/23  Yes Rodriguez-Southworth, Alee Gressman, PA-C  benzonatate  (TESSALON ) 200 MG capsule Take 1 capsule (200 mg total) by mouth 3 (three) times daily as needed for cough. 12/09/23  Yes Rodriguez-Southworth, Dystany Duffy, PA-C  oseltamivir  (TAMIFLU ) 75 MG capsule Take 1 capsule (75 mg total) by mouth every 12 (twelve) hours. 12/09/23  Yes Rodriguez-Southworth, Takahiro Godinho, PA-C  ibuprofen (ADVIL,MOTRIN) 200 MG tablet Take 400-600 mg by mouth as needed for mild pain or moderate pain. Headache or cramping    [provider]  Multiple Vitamins-Minerals (MULTIVITAMIN WITH MINERALS) tablet Take 1 tablet by mouth daily.    [provider]  naproxen  (NAPROSYN ) 375 MG tablet Take 1 tablet (375 mg total) by mouth 2 (two) times daily. 08/27/20   Randol Simmonds, MD    Family History Family History  Problem Relation Age of Onset   Diabetes Maternal Grandfather    Hypertension Maternal Grandfather     Social History Social History   Tobacco Use   Smoking status: Never   Smokeless tobacco: Never  Vaping Use   Vaping status: Never Used  Substance Use Topics   Alcohol use: Yes    Comment: Occassionally- 1x  /mth   Drug use: No     Allergies   Hydrocodone and Oxycodone -acetaminophen    Review of Systems Review of Systems  As noted in HPI  Physical Exam Triage Vital Signs ED Triage Vitals  Encounter Vitals Group     BP 12/09/23 1700 118/80     Systolic BP Percentile --      Diastolic BP Percentile --      Pulse Rate  12/09/23 1700 (!) 112     Resp 12/09/23 1700 (!) 21     Temp 12/09/23 1700 99.2 F (37.3 C)     Temp Source 12/09/23 1700 Oral     SpO2 12/09/23 1700 98 %     Weight 12/09/23 1702 175 lb (79.4 kg)     Height --      Head Circumference --      Peak Flow --      Pain Score 12/09/23 1702 7     Pain Loc --      Pain Education --      Exclude from Growth Chart --    No data found.  Updated Vital Signs BP 118/80 (BP Location: Right Arm)   Pulse (!) 112   Temp 99.2 F (37.3 C) (Oral)   Resp (!) 21   Wt 175 lb (79.4 kg)   LMP 12/27/2012   SpO2 98%   BMI 34.18 kg/m   Visual Acuity Right Eye Distance:   Left Eye Distance:   Bilateral Distance:    Right Eye Near:   Left Eye Near:    Bilateral Near:     Physical Exam Physical Exam Vitals signs and nursing note reviewed.  Constitutional:      General: She is not in acute distress.    Appearance: Normal appearance. She is not ill-appearing, toxic-appearing or diaphoretic. Deep breaths provokes her cough HENT:     Head: Normocephalic.     Right Ear: Tympanic membrane, ear canal and external ear normal.     Left Ear: Tympanic membrane, ear canal and external ear normal.     Nose: Nose normal.     Mouth/Throat:     Mouth: Mucous membranes are moist.  Eyes:     General: No scleral icterus.       Right eye: No discharge.        Left eye: No discharge.     Conjunctiva/sclera: Conjunctivae normal.  Neck:     Musculoskeletal: Neck supple. No neck rigidity.  Cardiovascular:     Rate and Rhythm: Normal rate and regular rhythm.     Heart sounds: No murmur.  Pulmonary:     Effort: Pulmonary effort is normal.     Breath sounds: Normal breath sounds.    Musculoskeletal: Normal range of motion.  Lymphadenopathy:     Cervical: No cervical adenopathy.  Skin:  General: Skin is warm and dry.     Coloration: Skin is not jaundiced.     Findings: No rash.  Neurological:     Mental Status: She is alert and oriented to person,  place, and time.     Gait: Gait normal.  Psychiatric:        Mood and Affect: Mood normal.        Behavior: Behavior normal.        Thought Content: Thought content normal.        Judgment: Judgment normal.    UC Treatments / Results  Labs (all labs ordered are listed, but only abnormal results are displayed) Labs Reviewed  POC COVID19/FLU A&B COMBO - Abnormal; Notable for the following components:      Result Value   Influenza A Antigen, POC Positive (*)    All other components within normal limits  Flu A is positive Flu and Covid negative  EKG   Radiology DG Chest 2 View Result Date: 12/09/2023 CLINICAL DATA:  Cough for 2 days. EXAM: CHEST - 2 VIEW COMPARISON:  May 03, 2023. FINDINGS: The heart size and mediastinal contours are within normal limits. Both lungs are clear. The visualized skeletal structures are unremarkable. IMPRESSION: No active cardiopulmonary disease. Electronically Signed   By: Lynwood Landy Raddle M.D.   On: 12/09/2023 17:58    Procedures Procedures (including critical care time)  Medications Ordered in UC Medications  albuterol  (PROVENTIL ) (2.5 MG/3ML) 0.083% nebulizer solution 2.5 mg (2.5 mg Nebulization Given 12/09/23 1722)    Initial Impression / Assessment and Plan / UC Course  I have reviewed the triage vital signs and the nursing notes. She was given albuterol  neb which helped with cough attacks.  Pertinent labs & imaging results that were available during my care of the patient were reviewed by me and considered in my medical decision making (see chart for details).  Influenza A  I placed her on Tamiflu  and Tessalon  as noted. May  fill the Albuterol  inhaler if the wheezing returns or cough attacks dont improve with current medications prescribed.    Final Clinical Impressions(s) / UC Diagnoses   Final diagnoses:  Acute cough  Influenza due to identified novel influenza A virus with other respiratory manifestations     Discharge Instructions       Your chest xray does not show pneumonia     ED Prescriptions     Medication Sig Dispense Auth. Provider   oseltamivir  (TAMIFLU ) 75 MG capsule Take 1 capsule (75 mg total) by mouth every 12 (twelve) hours. 10 capsule Rodriguez-Southworth, Malania Gawthrop, PA-C   benzonatate  (TESSALON ) 200 MG capsule Take 1 capsule (200 mg total) by mouth 3 (three) times daily as needed for cough. 30 capsule Rodriguez-Southworth, Jolan Mealor, PA-C   albuterol  (VENTOLIN  HFA) 108 (90 Base) MCG/ACT inhaler Inhale 2 puffs into the lungs every 4 (four) hours as needed for wheezing or shortness of breath. 1 each Rodriguez-Southworth, Kyra, PA-C      I have reviewed the PDMP during this encounter.   Lindi Kyra, PA-C 12/09/23 1819
